# Patient Record
Sex: Female | Born: 1970 | Race: White | Hispanic: No | Marital: Married | Smoking: Never smoker
Health system: Southern US, Community
[De-identification: ages and names within clinical notes are randomized; demographics above are authoritative.]

## PROBLEM LIST (undated history)

## (undated) DIAGNOSIS — F419 Anxiety disorder, unspecified: Secondary | ICD-10-CM

## (undated) DIAGNOSIS — T7840XA Allergy, unspecified, initial encounter: Secondary | ICD-10-CM

## (undated) HISTORY — DX: Allergy, unspecified, initial encounter: T78.40XA

## (undated) HISTORY — DX: Anxiety disorder, unspecified: F41.9

---

## 2020-03-31 LAB — EXTERNAL GENERIC LAB PROCEDURE: COLOGUARD: NEGATIVE

## 2020-08-28 ENCOUNTER — Encounter: Payer: Self-pay | Admitting: Emergency Medicine

## 2020-08-28 ENCOUNTER — Emergency Department (INDEPENDENT_AMBULATORY_CARE_PROVIDER_SITE_OTHER): Payer: 59

## 2020-08-28 ENCOUNTER — Emergency Department (INDEPENDENT_AMBULATORY_CARE_PROVIDER_SITE_OTHER)
Admission: EM | Admit: 2020-08-28 | Discharge: 2020-08-28 | Disposition: A | Discharge status: Home / Self Care | Payer: 59 | Source: Home / Self Care

## 2020-08-28 ENCOUNTER — Other Ambulatory Visit: Payer: Self-pay

## 2020-08-28 DIAGNOSIS — W19XXXA Unspecified fall, initial encounter: Secondary | ICD-10-CM

## 2020-08-28 DIAGNOSIS — S92324A Nondisplaced fracture of second metatarsal bone, right foot, initial encounter for closed fracture: Secondary | ICD-10-CM

## 2020-08-28 DIAGNOSIS — M79671 Pain in right foot: Secondary | ICD-10-CM

## 2020-08-28 DIAGNOSIS — S92341A Displaced fracture of fourth metatarsal bone, right foot, initial encounter for closed fracture: Secondary | ICD-10-CM | POA: Diagnosis not present

## 2020-08-28 DIAGNOSIS — S92334A Nondisplaced fracture of third metatarsal bone, right foot, initial encounter for closed fracture: Secondary | ICD-10-CM | POA: Diagnosis not present

## 2020-08-28 MED ORDER — OXYCODONE-ACETAMINOPHEN 5-325 MG PO TABS: 1.0000 | ORAL_TABLET | Freq: Four times a day (QID) | ORAL | 0 refills | Status: AC | PRN

## 2020-08-28 MED ORDER — ACETAMINOPHEN 325 MG PO TABS
650.0000 mg | ORAL_TABLET | Freq: Once | ORAL | Status: AC
  Administered 2020-08-28: 650 mg via ORAL

## 2020-08-28 NOTE — Discharge Instructions (Addendum)
Advised patient to wear cam boot 24/7, except with bathing until evaluated by orthopedic provider.  Pain medication provided to patient.  Advised/encouraged patient to take medication as prescribed with food.  Vies patient may take ibuprofen 800 mg 2-3 times daily, as needed.  Encourage patient to increase daily water intake while taking medications.

## 2020-08-28 NOTE — ED Provider Notes (Signed)
[facility]    CSN: [id number] Arrival date & time: 08/28/20  1837      History   Chief Complaint Chief Complaint  Patient presents with  . Foot Injury    Right     HPI Misty [name] is a 50 y.o. female.   HPI 50 year old female presents for right foot pain.  Reports stepping off a curb onto a rock and rolling her right foot at roughly 4 PM today.  Reports pain is predominantly sole of right foot.  History reviewed. No pertinent past medical history.  There are no problems to display for this patient.   Past Surgical History:  Procedure Laterality Date  . CESAREAN SECTION      OB History   No obstetric history on file.      Home Medications    Prior to Admission medications   Medication Sig Start Date End Date Taking? Authorizing Provider  oxyCODONE-acetaminophen (PERCOCET/ROXICET) 5-325 MG tablet Take 1 tablet by mouth every 6 (six) hours as needed for up to 6 days for severe pain. 08/28/20 09/03/20 Yes Trevor Iha, FNP    Family History Family History  Problem Relation Age of Onset  . Breast cancer Mother   . Hypertension Mother   . Healthy Father     Social History Social History   Tobacco Use  . Smoking status: Never Smoker  . Smokeless tobacco: Never Used  Vaping Use  . Vaping Use: Never used  Substance Use Topics  . Alcohol use: Yes    Comment: occasional  . Drug use: Never     Allergies   Patient has no known allergies.   Review of Systems Review of Systems  Constitutional: Negative.   HENT: Negative.   Eyes: Negative.   Respiratory: Negative.   Cardiovascular: Negative.   Gastrointestinal: Negative.   Genitourinary: Negative.   Musculoskeletal:       Right Foot pain  Skin: Negative.   Neurological: Negative.      Physical Exam Triage Vital Signs ED Triage Vitals  Enc Vitals Group     BP 08/28/20 1859 113/76     Pulse Rate 08/28/20 1859 78     Resp 08/28/20 1859 16     Temp 08/28/20 1859 99 F (37.2  C)     Temp Source 08/28/20 1859 Oral     SpO2 08/28/20 1859 98 %     Weight 08/28/20 1853 150 lb (68 kg)     Height 08/28/20 1853 5\' 5"  (1.651 m)     Head Circumference --      Peak Flow --      Pain Score 08/28/20 1853 5     Pain Loc --      Pain Edu? --      Excl. in GC? --    No data found.  Updated Vital Signs BP 113/76 (BP Location: Right Arm)   Pulse 78   Temp 99 F (37.2 C) (Oral)   Resp 16   Ht 5\' 5"  (1.651 m)   Wt 150 lb (68 kg)   SpO2 98%   BMI 24.96 kg/m    Physical Exam Vitals and nursing note reviewed.  Constitutional:      General: She is not in acute distress.    Appearance: Normal appearance. She is normal weight.  HENT:     Head: Normocephalic and atraumatic.  Eyes:     Extraocular Movements: Extraocular movements intact.     Conjunctiva/sclera: Conjunctivae normal.  Pupils: Pupils are equal, round, and reactive to light.  Cardiovascular:     Rate and Rhythm: Normal rate and regular rhythm.     Pulses: Normal pulses.     Heart sounds: Normal heart sounds.  Pulmonary:     Effort: Pulmonary effort is normal. No respiratory distress.     Breath sounds: Normal breath sounds. No stridor. No wheezing, rhonchi or rales.  Musculoskeletal:     Comments: Right foot: TTP over dorsal aspect (over metatarsal bases), moderate soft tissue swelling noted, severely LROM with dorsiflexion plantar flexion, and limited by pain.  Neurovascular intact, neurosensory intact.  Skin:    General: Skin is warm and dry.  Neurological:     General: No focal deficit present.     Mental Status: She is alert and oriented to person, place, and time.  Psychiatric:        Mood and Affect: Mood normal.        Behavior: Behavior normal.      UC Treatments / Results  Labs (all labs ordered are listed, but only abnormal results are displayed) Labs Reviewed - No data to display  EKG   Radiology DG Foot Complete Right  Result Date: 08/28/2020 CLINICAL DATA:  Recent  fall with foot pain, initial encounter EXAM: RIGHT FOOT COMPLETE - 3+ VIEW COMPARISON:  None. FINDINGS: Transverse fractures are noted through the proximal aspect of the second, third and fourth metatarsals. No other abnormality is noted. No significant soft tissue changes are seen. IMPRESSION: Second through fourth metatarsal fractures proximally. Electronically Signed   By: Alcide Clever M.D.   On: 08/28/2020 19:19    Procedures Procedures (including critical care time)  Medications Ordered in UC Medications  acetaminophen (TYLENOL) tablet 650 mg (650 mg Oral Given 08/28/20 1904)    Initial Impression / Assessment and Plan / UC Course  I have reviewed the triage vital signs and the nursing notes.  Pertinent labs & imaging results that were available during my care of the patient were reviewed by me and considered in my medical decision making (see chart for details).     MDM: 1.  Transverse fractures of second through fourth metatarsals.  Patient placed in cam walking boot and referral to [facility] orthopedic provider prior to discharge.  Patient discharged home, hemodynamically stable. Final Clinical Impressions(s) / UC Diagnoses   Final diagnoses:  Nondisplaced fracture of second metatarsal bone, right foot, initial encounter for closed fracture  Closed nondisplaced fracture of third metatarsal bone of right foot, initial encounter  Closed displaced fracture of fourth metatarsal bone of right foot, initial encounter     Discharge Instructions     Advised patient to wear cam boot 24/7, except with bathing until evaluated by orthopedic provider.  Pain medication provided to patient.  Advised/encouraged patient to take medication as prescribed with food.  Vies patient may take ibuprofen 800 mg 2-3 times daily, as needed.  Encourage patient to increase daily water intake while taking medications.    ED Prescriptions    Medication Sig Dispense Auth. Provider   oxyCODONE-acetaminophen  (PERCOCET/ROXICET) 5-325 MG tablet Take 1 tablet by mouth every 6 (six) hours as needed for up to 6 days for severe pain. 24 tablet Trevor Iha, FNP     I have reviewed the PDMP during this encounter.   Trevor Iha, FNP 08/28/20 2031

## 2020-08-28 NOTE — ED Triage Notes (Signed)
Stepped off a curb onto a rock and rolled R foot - pain to R foot on the bottom at 1600 Ibuprofen (600mg ) at 1615 RICE prior to arrival  Pain w/ ambulation  COVID vaccine - no booster

## 2020-08-28 NOTE — ED Notes (Signed)
Referral information sent to Dr Ardelle Anton via fax to [employer] office. Digital copy sent with pt. Pt to call [facility]  on Monday. Pt is new to area, PCP will be Christen Butter, 1st appointment June 8th.

## 2020-09-02 ENCOUNTER — Encounter: Payer: Self-pay | Admitting: Podiatry

## 2020-09-02 ENCOUNTER — Ambulatory Visit (INDEPENDENT_AMBULATORY_CARE_PROVIDER_SITE_OTHER): Payer: 59 | Admitting: Podiatry

## 2020-09-02 ENCOUNTER — Other Ambulatory Visit: Payer: Self-pay

## 2020-09-02 ENCOUNTER — Ambulatory Visit (INDEPENDENT_AMBULATORY_CARE_PROVIDER_SITE_OTHER): Payer: 59

## 2020-09-02 DIAGNOSIS — M84374A Stress fracture, right foot, initial encounter for fracture: Secondary | ICD-10-CM

## 2020-09-02 DIAGNOSIS — S99921A Unspecified injury of right foot, initial encounter: Secondary | ICD-10-CM

## 2020-09-02 DIAGNOSIS — R600 Localized edema: Secondary | ICD-10-CM | POA: Diagnosis not present

## 2020-09-02 NOTE — Progress Notes (Signed)
Subjective:   Patient ID: Misty [name], female   DOB: 50 y.o.   MRN: [id number]   HPI Patient states that she had a fall 5 days ago on 08/23/2020 and broke bones in her right foot.  She went to urgent care told she had a fracture and wanted her to see [place].  Patient is wearing a boot cannot currently bear weight on her foot and it is swollen and does not smoke likes to be active   Review of Systems  All other systems reviewed and are negative.       Objective:  Physical Exam Vitals and nursing note reviewed.  Constitutional:      Appearance: She is well-developed.  Pulmonary:     Effort: Pulmonary effort is normal.  Musculoskeletal:        General: Normal range of motion.  Skin:    General: Skin is warm.  Neurological:     Mental Status: She is alert.     Neurovascular status was found to be intact muscle strength adequate negative Homans' sign noted with significant edema of the midfoot right with swelling from the ankle to the forefoot with the pain centered around the third metatarsal.  Patient is found to have good digital perfusion well oriented x3     Assessment:  Probability for fracture of the midfoot right with pain     Plan:  H&P reviewed condition and fracture and x-ray and at this point applied Unna boot and Ace wrap dispensed surgical shoe continue nonweightbearing boot usage gradual surgical shoe usage and hopefully return to soft shoe gear in about 4 weeks.  Reappoint as indicated  X-rays indicate there appears to be a fracture of the base of third metatarsal right possible second metatarsal right no indications of Lisfranc's dislocation currently

## 2020-09-10 ENCOUNTER — Ambulatory Visit: Payer: 59 | Admitting: Medical-Surgical

## 2021-10-03 IMAGING — DX DG FOOT COMPLETE 3+V*R*
3 series · 3 of 3 positions shown · non-contrast
Comparison: None.

CLINICAL DATA: Recent fall with foot pain, initial encounter

EXAM:
RIGHT FOOT COMPLETE - 3+ VIEW

[foot ap]
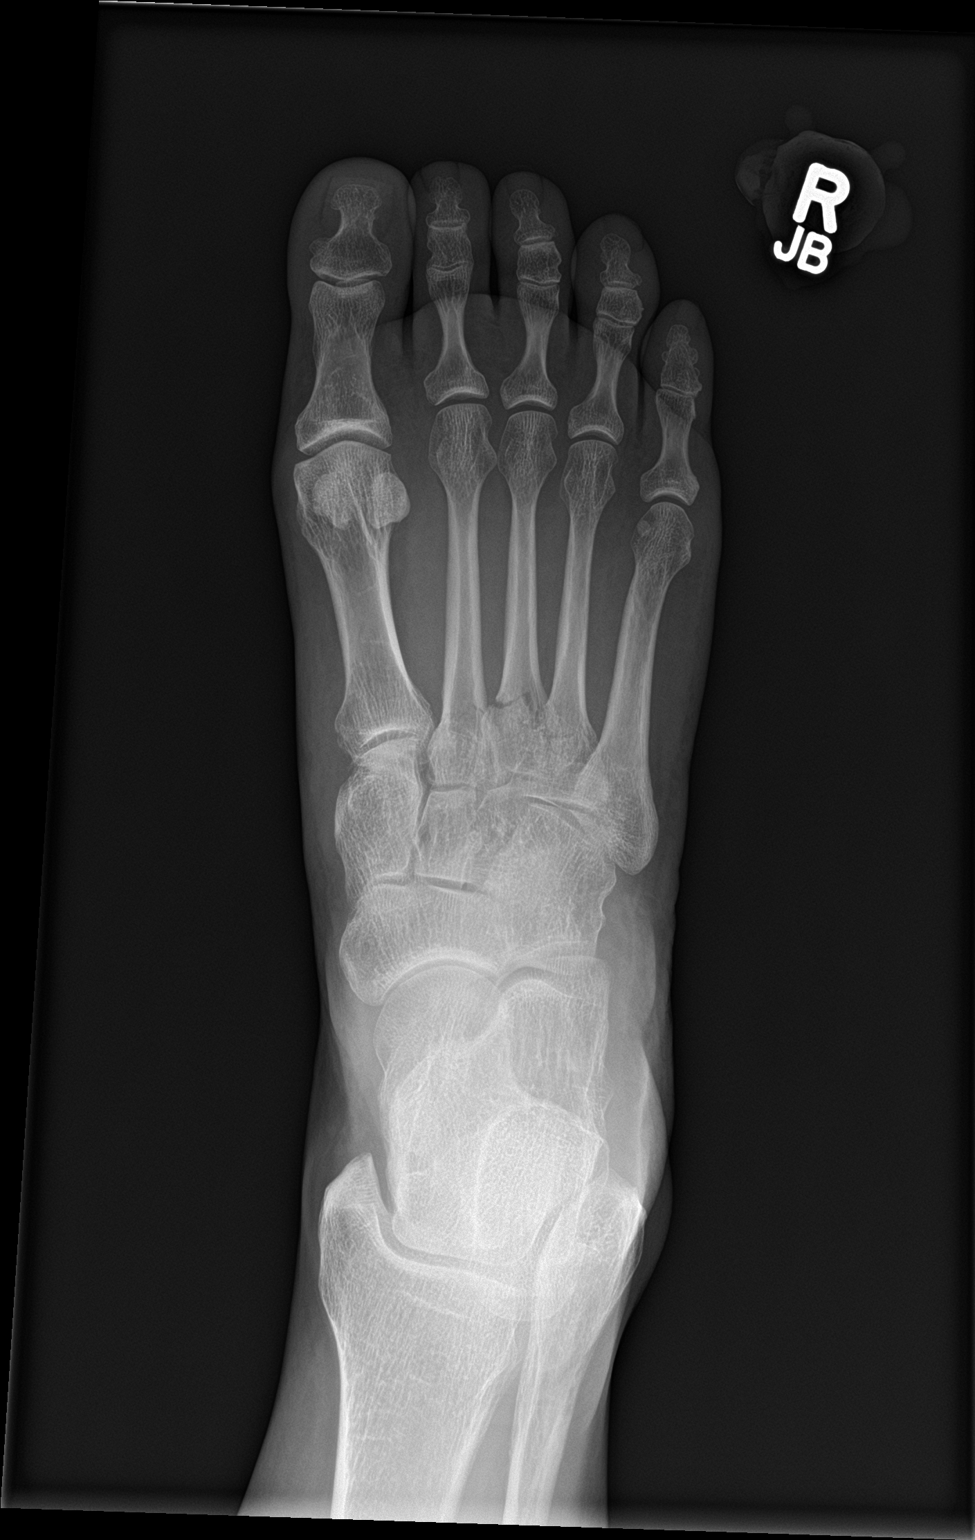

[foot obl]
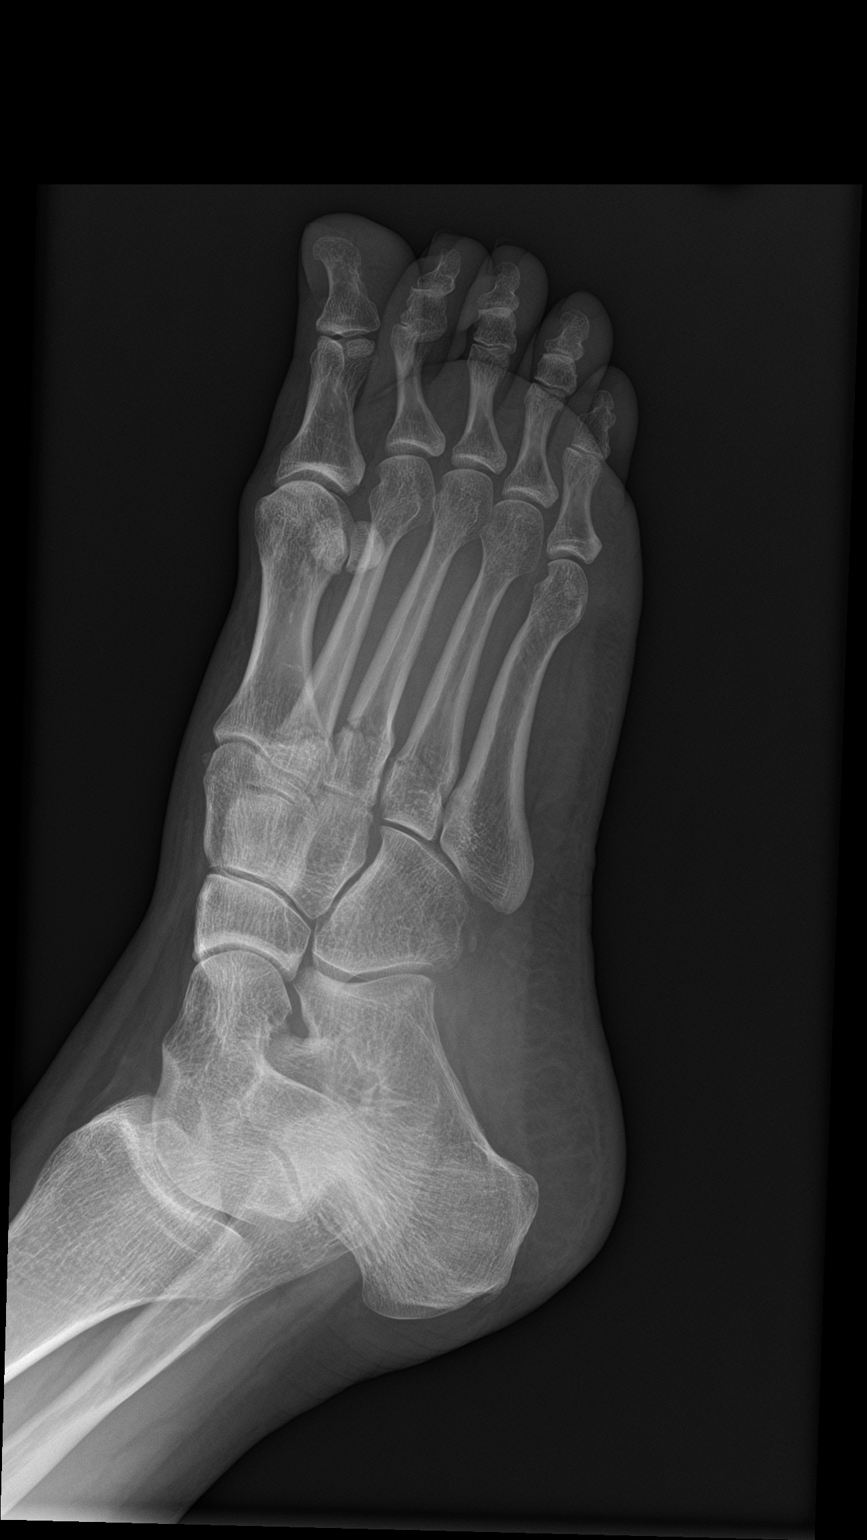

[foot lat]
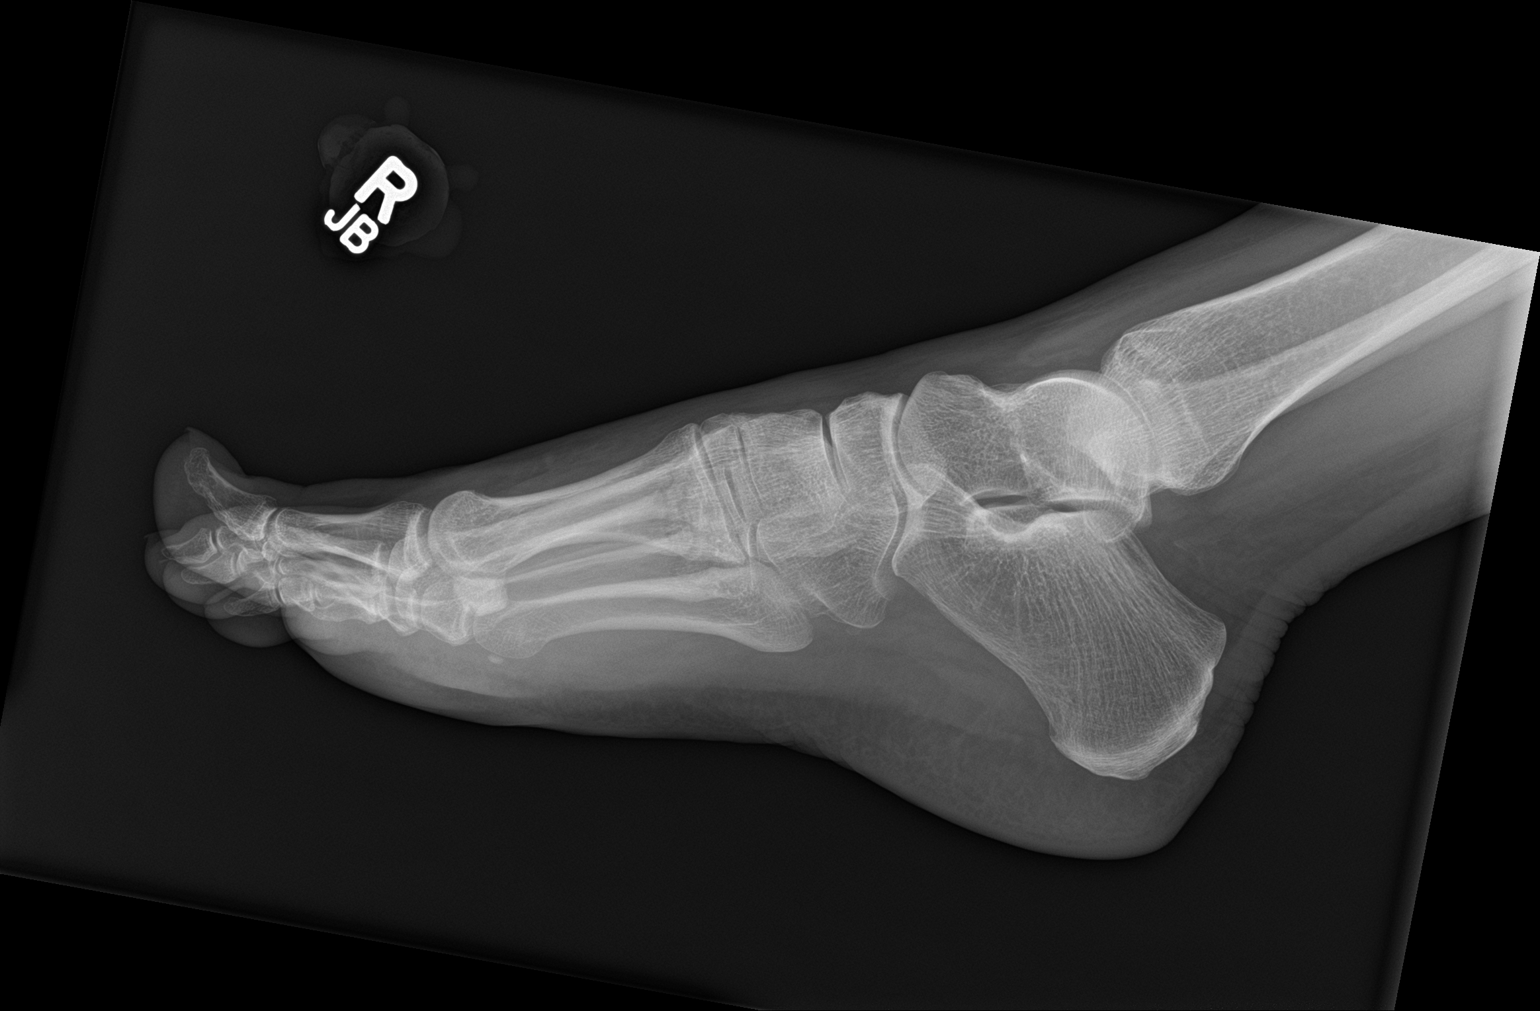

[3 of 3 positions shown; findings below may reference images not displayed]

FINDINGS: Transverse fractures are noted through the proximal aspect of the
second, third and fourth metatarsals. No other abnormality is noted.
No significant soft tissue changes are seen.
IMPRESSION: Second through fourth metatarsal fractures proximally.

## 2021-10-25 ENCOUNTER — Ambulatory Visit (INDEPENDENT_AMBULATORY_CARE_PROVIDER_SITE_OTHER): Payer: 59 | Admitting: Family Medicine

## 2021-10-25 ENCOUNTER — Encounter: Payer: Self-pay | Admitting: Family Medicine

## 2021-10-25 VITALS — BP 122/74 | HR 69 | Temp 98.6°F | Ht 65.0 in | Wt 157.5 lb

## 2021-10-25 DIAGNOSIS — Z0000 Encounter for general adult medical examination without abnormal findings: Secondary | ICD-10-CM

## 2021-10-25 DIAGNOSIS — Z1231 Encounter for screening mammogram for malignant neoplasm of breast: Secondary | ICD-10-CM

## 2021-10-25 LAB — CBC WITH DIFFERENTIAL/PLATELET
Basophils Absolute: 0 10*3/uL (ref 0.0–0.1)
Basophils Relative: 0.6 % (ref 0.0–3.0)
Eosinophils Absolute: 0.2 10*3/uL (ref 0.0–0.7)
Eosinophils Relative: 3.1 % (ref 0.0–5.0)
HCT: 39.3 % (ref 36.0–46.0)
Hemoglobin: 13 g/dL (ref 12.0–15.0)
Lymphocytes Relative: 32.7 % (ref 12.0–46.0)
Lymphs Abs: 2.1 10*3/uL (ref 0.7–4.0)
MCHC: 33.2 g/dL (ref 30.0–36.0)
MCV: 94.8 fl (ref 78.0–100.0)
Monocytes Absolute: 0.4 10*3/uL (ref 0.1–1.0)
Monocytes Relative: 5.9 % (ref 3.0–12.0)
Neutro Abs: 3.8 10*3/uL (ref 1.4–7.7)
Neutrophils Relative %: 57.7 % (ref 43.0–77.0)
Platelets: 372 10*3/uL (ref 150.0–400.0)
RBC: 4.14 Mil/uL (ref 3.87–5.11)
RDW: 13.4 % (ref 11.5–15.5)
WBC: 6.5 10*3/uL (ref 4.0–10.5)

## 2021-10-25 LAB — COMPREHENSIVE METABOLIC PANEL
ALT: 13 U/L (ref 0–35)
AST: 18 U/L (ref 0–37)
Albumin: 4.7 g/dL (ref 3.5–5.2)
Alkaline Phosphatase: 94 U/L (ref 39–117)
BUN: 15 mg/dL (ref 6–23)
CO2: 29 mEq/L (ref 19–32)
Calcium: 10.1 mg/dL (ref 8.4–10.5)
Chloride: 102 mEq/L (ref 96–112)
Creatinine, Ser: 0.73 mg/dL (ref 0.40–1.20)
GFR: 94.28 mL/min (ref >60.00)
Glucose, Bld: 85 mg/dL (ref 70–99)
Potassium: 4 mEq/L (ref 3.5–5.1)
Sodium: 141 mEq/L (ref 135–145)
Total Bilirubin: 0.4 mg/dL (ref 0.2–1.2)
Total Protein: 7.8 g/dL (ref 6.0–8.3)

## 2021-10-25 LAB — HEMOGLOBIN A1C: Hgb A1c MFr Bld: 5.5 % (ref 4.6–6.5)

## 2021-10-25 LAB — LIPID PANEL
Cholesterol: 168 mg/dL (ref 0–200)
HDL: 81.5 mg/dL (ref >39.00)
LDL Cholesterol: 77 mg/dL (ref 0–99)
NonHDL: 86.15
Total CHOL/HDL Ratio: 2
Triglycerides: 44 mg/dL (ref 0.0–149.0)
VLDL: 8.8 mg/dL (ref 0.0–40.0)

## 2021-10-25 LAB — TSH: TSH: 1.41 u[IU]/mL (ref 0.35–5.50)

## 2021-10-25 NOTE — Progress Notes (Signed)
New Patient Office Visit  Subjective:  Patient ID: Misty [name], female    DOB: Oct 28, 1970  Age: 51 y.o. MRN: [id number]  CC:  Chief Complaint  Patient presents with   Establish Care    Need new PCP Discuss weight and menopause Had coffee, no food this morning    HPI Misty [name] presents for new pt-moved 1.5 yrs ago-[place] cpx LMP 1 yr ago Wt-perimenopause 2020-gained 15-20#.  Working on diet.  Eats 1500cal/d.  Tried 1200cal and too exhausted so inc to 1500 again. Measures all foods. Bloats a lot and hands swell.  Drinks a lot of water. Walks twice weekly.   Gets hot at night a lot. Frustrated w/aging process/can't lose wt,  etc.   Takes collagen.   Pap 2 yrs ago Cologard 2 yrs ago Tdap <43yrs.   Past Medical History:  Diagnosis Date   Allergy    Anxiety     Past Surgical History:  Procedure Laterality Date   CESAREAN SECTION  2005    Family History  Problem Relation Age of Onset   Hearing loss Mother    Cancer Mother 64       breast, skin   Breast cancer Mother    Hypertension Mother    Healthy Father    Hearing loss Maternal Grandmother    Arthritis Maternal Grandmother    Early death Maternal Grandfather    Heart attack Maternal Grandfather    COPD Paternal Grandmother    Stroke Paternal Grandmother 65   Early death Paternal Grandmother    COPD Paternal Grandfather     Social History   Socioeconomic History   Marital status: Married    Spouse name: Not on file   Number of children: 1   Years of education: Not on file   Highest education level: Not on file  Occupational History   Not on file  Tobacco Use   Smoking status: Never   Smokeless tobacco: Never  Vaping Use   Vaping Use: Never used  Substance and Sexual Activity   Alcohol use: Yes    Alcohol/week: 5.0 - 7.0 standard drinks of alcohol    Types: 5 - 7 Glasses of wine per week    Comment: occasional   Drug use: Never   Sexual activity: Yes    Birth control/protection: None   Other Topics Concern   Not on file  Social History Narrative   Radio producer   Social Determinants of Health   Financial Resource Strain: Not on file  Food Insecurity: Not on file  Transportation Needs: Not on file  Physical Activity: Not on file  Stress: Not on file  Social Connections: Not on file  Intimate Partner Violence: Not on file    ROS  ROS: Gen: no fever, chills  Skin: no rash, itching ENT: no ear pain, ear drainage, nasal congestion, rhinorrhea, sinus pressure, sore throat Eyes: no blurry vision, double vision Resp: no cough, wheeze,SOB CV: no CP, palpitations, LE edema,  GI: no heartburn, n/v/d, abd pain.  Some bloating and constipation.  GU: no dysuria, urgency, frequency, hematuria MSK: some knee pain.  [place]are[place] Seat last yr-stepped off curb and fx L foot.  Neuro: no dizziness, headache, weakness, vertigo Psych: no depression, anxiety, insomnia, SI    a little edgy-takes otc supp-helps, but if takes 2, too much  Objective:   Today's Vitals: BP 122/74   Pulse 69   Temp 98.6 F (37 C) (Temporal)   Ht 5\' 5"  (1.651  m)   Wt 157 lb 8 oz (71.4 kg)   SpO2 99%   BMI 26.21 kg/m   Physical Exam  Gen: WDWN NAD wf HEENT: NCAT, conjunctiva not injected, sclera nonicteric TM WNL B, OP moist, no exudates  NECK:  supple, no thyromegaly, no nodes, no carotid bruits CARDIAC: RRR, S1S2+, no murmur. DP 2+B LUNGS: CTAB. No wheezes ABDOMEN:  BS+, soft, NTND, No HSM, no masses EXT:  no edema MSK: no gross abnormalities.  NEURO: A&O x3.  CN II-XII intact.  PSYCH: normal mood. Good eye contact   Assessment & Plan:   Problem List Items Addressed This Visit   None Visit Diagnoses     Encounter for screening mammogram for malignant neoplasm of breast    -  Primary   Relevant Orders   MM 3D SCREEN BREAST BILATERAL   Wellness examination       Relevant Orders   Comprehensive metabolic panel (Completed)   Hemoglobin A1c (Completed)   Lipid panel  (Completed)   TSH (Completed)   CBC with Differential/Platelet (Completed)      Wellness-antic guidance.  Check cbc,cmp,tsh,lipids,a1c.  Cont TLC.  Inc exercise.  Menopause-discussed normalcy of what she is going thru and listened to frustrations.  Since time limited, do shorter exercises but work on increasing from what she is doing. Decrease ETOH  Outpatient Encounter Medications as of 10/25/2021  Medication Sig   Ascorbic Acid (VITAMIN C PO) Take 1 tablet by mouth daily.   ASHWAGANDHA PO Take 2 tablets by mouth daily. 1-2 tablets daily   EVENING PRIMROSE OIL PO Take 2 tablets by mouth daily.   ibuprofen (ADVIL) 200 MG tablet Take 200 mg by mouth every 6 (six) hours as needed.   MAGNESIUM PO Take 1 tablet by mouth daily.   Multiple Vitamin (MULTIVITAMIN) tablet Take 1 tablet by mouth daily.   VITAMIN D PO Take 1 tablet by mouth daily.   No facility-administered encounter medications on file as of 10/25/2021.    Follow-up: Return in about 1 year (around 10/26/2022) for annual.   Misty Sole, MD

## 2021-10-25 NOTE — Patient Instructions (Signed)
Welcome to [facility] at [place]! It was a pleasure meeting you today. ° °As discussed, Please schedule a 12 month follow up visit today. ° °PLEASE NOTE: ° °If you had any LAB tests please let us know if you have not heard back within a few days. You may see your results on MyChart before we have a chance to review them but we will give you a call once they are reviewed by us. If we ordered any REFERRALS today, please let us know if you have not heard from their office within the next week.  °Let us know through MyChart if you are needing REFILLS, or have your pharmacy send us the request. You can also use MyChart to communicate with me or any office staff. ° °Please try these tips to maintain a healthy lifestyle: ° °Eat most of your calories during the day when you are active. Eliminate processed foods including packaged sweets (pies, cakes, cookies), reduce intake of potatoes, white bread, white pasta, and white rice. Look for whole grain options, oat flour or almond flour. ° °Each meal should contain half fruits/vegetables, one quarter protein, and one quarter carbs (no bigger than a computer mouse). ° °Cut down on sweet beverages. This includes juice, soda, and sweet tea. Also watch fruit intake, though this is a healthier sweet option, it still contains natural sugar! Limit to 3 servings daily. ° °Drink at least 1 glass of water with each meal and aim for at least 8 glasses per day ° °Exercise at least 150 minutes every week.   °

## 2021-11-03 ENCOUNTER — Ambulatory Visit
Admission: RE | Admit: 2021-11-03 | Discharge: 2021-11-03 | Disposition: A | Discharge status: Home / Self Care | Payer: 59 | Source: Ambulatory Visit | Attending: Family Medicine | Admitting: Family Medicine

## 2021-11-03 DIAGNOSIS — Z1231 Encounter for screening mammogram for malignant neoplasm of breast: Secondary | ICD-10-CM

## 2021-11-15 ENCOUNTER — Encounter: Payer: Self-pay | Admitting: Family Medicine

## 2021-11-15 LAB — HM MAMMOGRAPHY

## 2022-02-22 ENCOUNTER — Encounter: Payer: Self-pay | Admitting: Family Medicine

## 2022-02-22 ENCOUNTER — Telehealth (INDEPENDENT_AMBULATORY_CARE_PROVIDER_SITE_OTHER): Payer: 59 | Admitting: Family Medicine

## 2022-02-22 VITALS — Temp 99.8°F | Ht 65.0 in | Wt 150.0 lb

## 2022-02-22 DIAGNOSIS — U071 COVID-19: Secondary | ICD-10-CM | POA: Diagnosis not present

## 2022-02-22 MED ORDER — NIRMATRELVIR/RITONAVIR (PAXLOVID)TABLET: 3.0000 | ORAL_TABLET | Freq: Two times a day (BID) | ORAL | 0 refills | Status: AC

## 2022-02-22 NOTE — Progress Notes (Signed)
Virtual Visit via Video Note  I connected with Misty [name]  on 02/22/22 at 12:00 PM EST by a video enabled telemedicine application and verified that I am speaking with the correct person using two identifiers.  Location patient: [name] Location provider:work or home office Persons participating in the virtual visit: patient, provider  I discussed the limitations and requested verbal permission for telemedicine visit. The patient expressed understanding and agreed to proceed.   HPI:  Acute telemedicine visit for Covid: -Onset: yesterday, tested positive yesterday -Symptoms include: nasal congestion, fever yesterday, chills, headache -Denies: CP, SOB, NVD, inability to eat/drink/get out of bed -Has tried: otc cold and sinus  -Pertinent past medical history: see below, has not had covid before, denies immune, renal or liver disease, GFR was 94 in July -Pertinent medication allergies:No Known Allergies -COVID-19 vaccine status: had 2 doses only several years ago  There is no immunization history on file for this patient.   ROS: See pertinent positives and negatives per HPI.  Past Medical History:  Diagnosis Date   Allergy    Anxiety     Past Surgical History:  Procedure Laterality Date   CESAREAN SECTION  2005     Current Outpatient Medications:    Ascorbic Acid (VITAMIN C PO), Take 1 tablet by mouth daily., Disp: , Rfl:    ASHWAGANDHA PO, Take 2 tablets by mouth daily. 1-2 tablets daily, Disp: , Rfl:    ibuprofen (ADVIL) 200 MG tablet, Take 200 mg by mouth every 6 (six) hours as needed., Disp: , Rfl:    MAGNESIUM PO, Take 1 tablet by mouth daily., Disp: , Rfl:    Multiple Vitamin (MULTIVITAMIN) tablet, Take 1 tablet by mouth daily., Disp: , Rfl:    nirmatrelvir/ritonavir EUA (PAXLOVID) 20 x 150 MG & 10 x 100MG  TABS, Take 3 tablets by mouth 2 (two) times daily for 5 days. (Take nirmatrelvir 150 mg two tablets twice daily for 5 days and ritonavir 100 mg one tablet twice daily for 5  days) Patient GFR is > 60 in july, Disp: 30 tablet, Rfl: 0   VITAMIN D PO, Take 1 tablet by mouth daily., Disp: , Rfl:    EVENING PRIMROSE OIL PO, Take 2 tablets by mouth daily. (Patient not taking: Reported on 02/22/2022), Disp: , Rfl:   EXAM:  VITALS per patient if applicable:  GENERAL: alert, oriented, appears well and in no acute distress  HEENT: atraumatic, conjunttiva clear, no obvious abnormalities on inspection of external nose and ears  NECK: normal movements of the head and neck  LUNGS: on inspection no signs of respiratory distress, breathing rate appears normal, no obvious gross SOB, gasping or wheezing  CV: no obvious cyanosis  MS: moves all visible extremities without noticeable abnormality  PSYCH/NEURO: pleasant and cooperative, no obvious depression or anxiety, speech and thought processing grossly intact  ASSESSMENT AND PLAN:  Discussed the following assessment and plan:  COVID-19   Discussed treatment options, side effect and risk of drug interactions, ideal treatment window, potential complications, isolation and precautions for COVID-19.  Discussed possibility of rebound with or without antivirals. Checked for/reviewed last GFR - listed in HPI if available. After lengthy discussion, the patient opted for treatment with Paxlovid due to being higher risk for complications of covid or severe disease and other factors. Discussed EUA status of this drug and the fact that there is preliminary limited knowledge of risks/interactions/side effects per EUA document vs possible benefits and precautions. This information was shared with patient during the visit  and also was provided in patient instructions. Also, advised that patient discuss risks/interactions and use with pharmacist/treatment team as well. She agrees to hold her supplements while taking the paxlovid.Other symptomatic care measures summarized in patient instructions. Work/School slipped offered:   declined Advised to seek prompt virtual visit or in person care if worsening, new symptoms arise, or if is not improving with treatment as expected per our conversation of expected course. Discussed options for follow up care. Did let this patient know that I do telemedicine on Tuesdays and Thursdays for [place] and those are the days I am logged into the system. Advised to schedule follow up visit with PCP, [place] virtual visits or [facility] if any further questions or concerns to avoid delays in care.   I discussed the assessment and treatment plan with the patient. The patient was provided an opportunity to ask questions and all were answered. The patient agreed with the plan and demonstrated an understanding of the instructions.     Misty Koyanagi, DO

## 2022-02-22 NOTE — Patient Instructions (Signed)
HOME CARE TIPS:   -I sent the medication(s) we discussed to your pharmacy: Meds ordered this encounter  Medications   nirmatrelvir/ritonavir EUA (PAXLOVID) 20 x 150 MG & 10 x 100MG  TABS    Sig: Take 3 tablets by mouth 2 (two) times daily for 5 days. (Take nirmatrelvir 150 mg two tablets twice daily for 5 days and ritonavir 100 mg one tablet twice daily for 5 days) Patient GFR is > 60 in july    Dispense:  30 tablet    Refill:  0     -I sent in the Covid19 treatment or referral you requested per our discussion. Please see the information provided below and discuss further with the pharmacist/treatment team.  -If taking Paxlovid, please review all medications, supplement and over the counter drugs with your pharmacist and ask them to check for any interactions. Please make the following changes to your regular medications while taking Paxlovid: *HOLD you vitamins and supplements while taking the Paxlovid, can restart 3 days after finishing the Paxlovid  -there is a chance of rebound illness with covid after improving. This can happen whether or not you take an antiviral treatment. If you become sick again with covid after getting better, please schedule a follow up virtual visit and isolate again.  -nasal saline sinus rinses twice daily  -stay hydrated, drink plenty of fluids and eat small healthy meals - avoid dairy  -follow up with your doctor in 2-3 days unless improving and feeling better  -stay home while sick, except to seek medical care. If you have COVID19, you will likely be contagious for 7-10 days. Flu or Influenza is likely contagious for about 7 days. Other respiratory viral infections remain contagious for 5-10+ days depending on the virus and many other factors. Wear a good mask that fits snugly (such as N95 or KN95) if around others to reduce the risk of transmission.  It was nice to meet you today, and I really hope you are feeling better soon. I help Lloyd out with  telemedicine visits on Tuesdays and Thursdays and am happy to help if you need a follow up virtual visit on those days. Otherwise, if you have any concerns or questions following this visit please schedule a follow up visit with your Primary Care doctor or seek care at a local urgent care clinic to avoid delays in care.    Seek in person care or schedule a follow up video visit promptly if your symptoms worsen, new concerns arise or you are not improving with treatment. Call 911 and/or seek emergency care if your symptoms are severe or life threatening.   See the following link for the most recent information regarding Paxlovid:  www.[email or web address].com   Nirmatrelvir; Ritonavir Tablets What is this medication? NIRMATRELVIR; RITONAVIR (NIR ma TREL vir; ri TOE na veer) treats mild to moderate COVID-19. It may help people who are at high risk of developing severe illness. This medication works by limiting the spread of the virus in your body. The FDA has allowed the emergency use of this medication. This medicine may be used for other purposes; ask your health care provider or pharmacist if you have questions. COMMON BRAND NAME(S): PAXLOVID What should I tell my care team before I take this medication? They need to know if you have any of these conditions: Any allergies Any serious illness Kidney disease Liver disease An unusual or allergic reaction to nirmatrelvir, ritonavir, other medications, foods, dyes, or preservatives Pregnant or trying to get pregnant Breast-feeding  How should I use this medication? This product contains 2 different medications that are packaged together. For the standard dose, take 2 pink tablets of nirmatrelvir with 1 white tablet of ritonavir (3 tablets total) by mouth with water twice daily. Talk to your care team if you have kidney disease. You may need a different dose. Swallow the tablets whole. You can take it with or without food. If it upsets your stomach, take it  with food. Take all of this medication unless your care team tells you to stop it early. Keep taking it even if you think you are better. Talk to your care team about the use of this medication in children. While it may be prescribed for children as young as 12 years for selected conditions, precautions do apply. Overdosage: If you think you have taken too much of this medicine contact a poison control center or emergency room at once. NOTE: This medicine is only for you. Do not share this medicine with others. What if I miss a dose? If you miss a dose, take it as soon as you can unless it is more than 8 hours late. If it is more than 8 hours late, skip the missed dose. Take the next dose at the normal time. Do not take extra or 2 doses at the same time to make up for the missed dose. What may interact with this medication? Do not take this medication with any of the following medications: Alfuzosin Certain medications for anxiety or sleep like midazolam, triazolam Certain medications for cancer like apalutamide, enzalutamide Certain medications for cholesterol like lovastatin, simvastatin Certain medications for irregular heart beat like amiodarone, dronedarone, flecainide, propafenone, quinidine Certain medications for pain like meperidine, piroxicam Certain medications for psychotic disorders like clozapine, lurasidone, pimozide Certain medications for seizures like carbamazepine, phenobarbital, phenytoin Colchicine Eletriptan Eplerenone Ergot alkaloids like dihydroergotamine, ergonovine, ergotamine, methylergonovine Finerenone Flibanserin Ivabradine Lomitapide Naloxegol Ranolazine Rifampin Sildenafil Silodosin St. John's Wort Tolvaptan Ubrogepant Voclosporin This medication may also interact with the following medications: Bedaquiline Birth control pills Bosentan Certain antibiotics like erythromycin or clarithromycin Certain medications for blood pressure like amlodipine,  diltiazem, felodipine, nicardipine, nifedipine Certain medications for cancer like abemaciclib, ceritinib, dasatinib, encorafenib, ibrutinib, ivosidenib, neratinib, nilotinib, venetoclax, vinblastine, vincristine Certain medications for cholesterol like atorvastatin, rosuvastatin Certain medications for depression like bupropion, trazodone Certain medications for fungal infections like isavuconazonium, itraconazole, ketoconazole, voriconazole Certain medications for hepatitis C like elbasvir; grazoprevir, dasabuvir; ombitasvir; paritaprevir; ritonavir, glecaprevir; pibrentasvir, sofosbuvir; velpatasvir; voxilaprevir Certain medications for HIV or AIDS Certain medications for irregular heartbeat like lidocaine Certain medications that treat or prevent blood clots like rivaroxaban, warfarin Digoxin Fentanyl Medications that lower your chance of fighting infection like cyclosporine, sirolimus, tacrolimus Methadone Quetiapine Rifabutin Salmeterol Steroid medications like betamethasone, budesonide, ciclesonide, dexamethasone, fluticasone, methylprednisone, mometasone, triamcinolone This list may not describe all possible interactions. Give your health care provider a list of all the medicines, herbs, non-prescription drugs, or dietary supplements you use. Also tell them if you smoke, drink alcohol, or use illegal drugs. Some items may interact with your medicine. What should I watch for while using this medication? Your condition will be monitored carefully while you are receiving this medication. Visit your care team for regular checkups. Tell your care team if your symptoms do not start to get better or if they get worse. If you have untreated HIV infection, this medication may lead to some HIV medications not working as well in the future. Birth control may not work properly while you  are taking this medication. Talk to your care team about using an extra method of birth control. What side  effects may I notice from receiving this medication? Side effects that you should report to your care team as soon as possible: Allergic reactions--skin rash, itching, hives, swelling of the face, lips, tongue, or throat Liver injury--right upper belly pain, loss of appetite, nausea, light-colored stool, dark yellow or brown urine, yellowing skin or eyes, unusual weakness or fatigue Redness, blistering, peeling, or loosening of the skin, including inside the mouth Side effects that usually do not require medical attention (report these to your care team if they continue or are bothersome): Change in taste Diarrhea General discomfort and fatigue Increase in blood pressure Muscle pain Nausea Stomach pain This list may not describe all possible side effects. Call your doctor for medical advice about side effects. You may report side effects to FDA at 1-800-FDA-1088. Where should I keep my medication? Keep out of the reach of children and pets. Store at room temperature between 20 and 25 degrees C (68 and 77 degrees F). Get rid of any unused medication after the expiration date. To get rid of medications that are no longer needed or have expired: Take the medication to a medication take-back program. Check with your pharmacy or law enforcement to find a location. If you cannot return the medication, check the label or package insert to see if the medication should be thrown out in the garbage or flushed down the toilet. If you are not sure, ask your care team. If it is safe to put it in the trash, take the medication out of the container. Mix the medication with cat litter, dirt, coffee grounds, or other unwanted substance. Seal the mixture in a bag or container. Put it in the trash. NOTE: This sheet is a summary. It may not cover all possible information. If you have questions about this medicine, talk to your doctor, pharmacist, or health care provider.  2022 Elsevier/Gold Standard (2020-12-28  00:00:00)

## 2022-07-21 ENCOUNTER — Ambulatory Visit (INDEPENDENT_AMBULATORY_CARE_PROVIDER_SITE_OTHER): Payer: Self-pay | Admitting: Family

## 2022-07-21 ENCOUNTER — Encounter: Payer: Self-pay | Admitting: Family

## 2022-07-21 VITALS — BP 124/85 | HR 65 | Temp 98.2°F | Wt 157.8 lb

## 2022-07-21 DIAGNOSIS — L0291 Cutaneous abscess, unspecified: Secondary | ICD-10-CM

## 2022-07-21 MED ORDER — DOXYCYCLINE HYCLATE 100 MG PO TABS: 100.0000 mg | ORAL_TABLET | Freq: Two times a day (BID) | ORAL | 0 refills | Status: AC

## 2022-07-21 NOTE — Progress Notes (Signed)
Patient ID: Misty [name], female    DOB: Nov 27, 1970, 52 y.o.   MRN: [id number]  Chief Complaint  Patient presents with   Shoulder Pain    Pt c/o right shoulder pain since this morning. Hx of mrsa     HPI:      Skin cyst:  reports having a tender cyst on upper right posterior shoulder. Reports having same type of cyst in same location but on her other side. States she waited too long to be seen and it required draining, suturing and antibiotics. Denies any spontaneous drainage, no fever.  Assessment & Plan:  1. Abscess - advised pt to wear a soft strap bra or sports bra to avoid irritating cyst and causing to spontaneously open & drain. Sending DOXY, advised on use & SE. Advised if cyst does open and drain, gently express all pus as able, clean with soap and water and apply thin layer of Bacitracin ointment (provided samples in office). Keep covered until no longer draining. Advised on s/s of infection and when to call the office.  - doxycycline (VIBRA-TABS) 100 MG tablet; Take 1 tablet (100 mg total) by mouth 2 (two) times daily for 10 days.  Dispense: 20 tablet; Refill: 0  Subjective:    Outpatient Medications Prior to Visit  Medication Sig Dispense Refill   Ascorbic Acid (VITAMIN C PO) Take 1 tablet by mouth daily.     ASHWAGANDHA PO Take 2 tablets by mouth daily. 1-2 tablets daily     EVENING PRIMROSE OIL PO Take 2 tablets by mouth daily.     ibuprofen (ADVIL) 200 MG tablet Take 200 mg by mouth every 6 (six) hours as needed.     MAGNESIUM PO Take 1 tablet by mouth daily.     Multiple Vitamin (MULTIVITAMIN) tablet Take 1 tablet by mouth daily.     VITAMIN D PO Take 1 tablet by mouth daily.     No facility-administered medications prior to visit.   Past Medical History:  Diagnosis Date   Allergy    Anxiety    Past Surgical History:  Procedure Laterality Date   CESAREAN SECTION  2005   No Known Allergies    Objective:    Physical Exam Vitals and nursing note reviewed.   Constitutional:      Appearance: Normal appearance.  Cardiovascular:     Rate and Rhythm: Normal rate and regular rhythm.  Pulmonary:     Effort: Pulmonary effort is normal.     Breath sounds: Normal breath sounds.  Musculoskeletal:        General: Normal range of motion.       Arms:  Skin:    General: Skin is warm and dry.     Findings: Abscess (approx 2cm in diameter, pink skin, approx. 3cm of induration noted in 10:00-11:00 area from cyst) present.  Neurological:     Mental Status: She is alert.     Gait: Abnormal gait: skin cyst.  Psychiatric:        Mood and Affect: Mood normal.        Behavior: Behavior normal.    BP 124/85 (BP Location: Left Arm, Patient Position: Sitting, Cuff Size: Normal)   Pulse 65   Temp 98.2 F (36.8 C) (Temporal)   Wt 157 lb 12.8 oz (71.6 kg)   SpO2 100%   BMI 26.26 kg/m  Wt Readings from Last 3 Encounters:  07/21/22 157 lb 12.8 oz (71.6 kg)  02/22/22 150 lb (68 kg)  10/25/21  157 lb 8 oz (71.4 kg)     Dulce Sellar, NP

## 2022-08-05 ENCOUNTER — Telehealth: Payer: Self-pay | Admitting: Family Medicine

## 2022-08-05 DIAGNOSIS — L0291 Cutaneous abscess, unspecified: Secondary | ICD-10-CM

## 2022-08-05 NOTE — Telephone Encounter (Signed)
Patient states symptoms have not improved-finished antibiotic prescribed 07/21/22.  Requests to be called to be advised.

## 2022-08-05 NOTE — Telephone Encounter (Signed)
Please advise, saw Hudnell on 07/21/22

## 2022-08-09 NOTE — Telephone Encounter (Signed)
Send a referral to Dermatology ([facility]) but this can take a month to get seen, she may need to go to UC to get excised or see if Dr Ruthine Dose will do this for her.

## 2022-08-10 ENCOUNTER — Ambulatory Visit (INDEPENDENT_AMBULATORY_CARE_PROVIDER_SITE_OTHER): Payer: BC Managed Care – PPO | Admitting: Family Medicine

## 2022-08-10 ENCOUNTER — Encounter: Payer: Self-pay | Admitting: Family Medicine

## 2022-08-10 VITALS — BP 126/86 | HR 71 | Temp 98.6°F | Resp 16 | Ht 65.0 in | Wt 158.0 lb

## 2022-08-10 DIAGNOSIS — L989 Disorder of the skin and subcutaneous tissue, unspecified: Secondary | ICD-10-CM

## 2022-08-10 NOTE — Progress Notes (Signed)
   Subjective:     Patient ID: Misty [name], female    DOB: 1970-08-04, 52 y.o.   MRN: [id number]  Chief Complaint  Patient presents with   Cyst    Right shoulder    HPI Cyst right shoulder-seen 4/11.  No drainage.  No pain.  Placed on doxycycline-slightly better but still there.  No fevers/chills.  +  history of skin cancer.  History of cyst same area and spot rupture, returned 2 years later and abx and resolved.    Health Maintenance Due  Topic Date Due   COVID-19 Vaccine (1) Never done   DTaP/Tdap/Td (1 - Tdap) Never done   Zoster Vaccines- Shingrix (1 of 2) Never done   PAP SMEAR-Modifier  10/10/2022    Past Medical History:  Diagnosis Date   Allergy    Anxiety     Past Surgical History:  Procedure Laterality Date   CESAREAN SECTION  2005     Current Outpatient Medications:    Ascorbic Acid (VITAMIN C PO), Take 1 tablet by mouth daily., Disp: , Rfl:    ASHWAGANDHA PO, Take 2 tablets by mouth daily. 1-2 tablets daily, Disp: , Rfl:    EVENING PRIMROSE OIL PO, Take 2 tablets by mouth daily., Disp: , Rfl:    ibuprofen (ADVIL) 200 MG tablet, Take 200 mg by mouth every 6 (six) hours as needed., Disp: , Rfl:    MAGNESIUM PO, Take 1 tablet by mouth daily., Disp: , Rfl:    Multiple Vitamin (MULTIVITAMIN) tablet, Take 1 tablet by mouth daily., Disp: , Rfl:    VITAMIN D PO, Take 1 tablet by mouth daily., Disp: , Rfl:   No Known Allergies ROS neg/noncontributory except as noted HPI/below      Objective:     BP 126/86   Pulse 71   Temp 98.6 F (37 C) (Temporal)   Resp 16   Ht 5\' 5"  (1.651 m)   Wt 158 lb (71.7 kg)   SpO2 95%   BMI 26.29 kg/m  Wt Readings from Last 3 Encounters:  08/10/22 158 lb (71.7 kg)  07/21/22 157 lb 12.8 oz (71.6 kg)  02/22/22 150 lb (68 kg)    Physical Exam   Gen: WDWN NAD HEENT: NCAT, conjunctiva not injected, sclera nonicteric EXT:  no edema MSK: no gross abnormalities.  NEURO: A&O x3.  CN II-XII intact.  PSYCH: normal mood.  Good eye contact Skin-right back-shoulder blade area-approximately 4 mm slightly darker than flesh color, raised, flaccid annular lesion with umbilicated center.  Easily pushes down under skin.  There is some vasculature leading up to it.  No sign or symptoms of infection.     Assessment & Plan:  Skin lesion  Skin lesion-question if partly recurrent sebaceous cyst, possibly scar remnant, possibly skin cancer.  Currently is not infected.  She will call dermatology and try to get an appointment.  If needs a referral, she will let us know.  If cannot get an appointment within the next 1 to 2 months, let us know and will refer to either surgery or plastics  Angelena Sole, MD

## 2022-08-10 NOTE — Telephone Encounter (Signed)
Referral sent, I spoke to pt in regards.

## 2022-08-10 NOTE — Patient Instructions (Signed)
See dermatology.  If >2-3 months, let me know to send to surgeon/plastics

## 2022-10-19 ENCOUNTER — Ambulatory Visit (INDEPENDENT_AMBULATORY_CARE_PROVIDER_SITE_OTHER): Payer: BC Managed Care – PPO | Admitting: Dermatology

## 2022-10-19 ENCOUNTER — Encounter: Payer: Self-pay | Admitting: Dermatology

## 2022-10-19 VITALS — BP 133/89

## 2022-10-19 DIAGNOSIS — L720 Epidermal cyst: Secondary | ICD-10-CM | POA: Diagnosis not present

## 2022-10-19 DIAGNOSIS — L729 Follicular cyst of the skin and subcutaneous tissue, unspecified: Secondary | ICD-10-CM

## 2022-10-19 MED ORDER — DOXYCYCLINE HYCLATE 100 MG PO CAPS: 100.0000 mg | ORAL_CAPSULE | Freq: Two times a day (BID) | ORAL | 0 refills | Status: AC

## 2022-10-19 NOTE — Progress Notes (Signed)
   New Patient Visit   Subjective  Misty [name] is a 51 y.o. female who presents for the following: She has a cyst on the right shoulder x 6 years. It comes and goes. It is not currently painful or draining. She had an I and D 6 years ago when it first appeared. She was treated by PCP in April with Doxy 100mg  x 10 days which helped with inflammation.   The following portions of the chart were reviewed this encounter and updated as appropriate: medications, allergies, medical history  Review of Systems:  No other skin or systemic complaints except as noted in HPI or Assessment and Plan.  Objective  Well appearing patient in no apparent distress; mood and affect are within normal limits.   A focused examination was performed of the following areas: Right shoulder  Relevant exam findings are noted in the Assessment and Plan.    Assessment & Plan   EPIDERMAL INCLUSION CYST Exam: Right post shoulder: indurated area with a small palpable subcutaneous structure  Benign-appearing. Exam most consistent with an epidermal inclusion cyst. Discussed that a cyst is a benign growth that can grow over time and sometimes get irritated or inflamed. Recommend observation if it is not bothersome. Discussed option of surgical excision to remove it if it is growing, symptomatic, or other changes noted. Please call for new or changing lesions so they can be evaluated. Discussed risks associated with surgical excision including keloid type scarring. Doxy 100mg  bid x 5 days as needed for flares # 20. Advised taking at onset of inflammatory symptoms.     Return in about 3 months (around 01/19/2023) for TBSE.  Jaclynn Guarneri, CMA, am acting as scribe for [employer], DO.   Documentation: I have reviewed the above documentation for accuracy and completeness, and I agree with the above.  Langston Reusing, DO

## 2022-10-19 NOTE — Patient Instructions (Addendum)
Thank you for visiting my office today. I appreciate your commitment to addressing your health concerns and am glad we could discuss the condition of your cyst.  Here are the key instructions and recommendations from our consultation:  - Medication: I have prescribed Doxycycline 100 mg, to be taken as one tablet twice a day for five days as needed if the cyst becomes inflamed. This prescription is for 20 tablets, sufficient for two flare-ups. Remember to take this medication with food to avoid stomach upset.  - Observation: Currently, the cyst is not inflamed or causing discomfort. We will continue with a conservative approach, monitoring the cyst without immediate surgical intervention due to the potential for scarring and keloids in the shoulder area.  - Surgical Consideration: We discussed the possibility of surgery to remove the cyst permanently. However, considering the location and the risk of scarring and keloids, we decided it might not be worth pursuing surgery at this time unless the cyst's condition changes significantly.  - Follow-Up: Please keep an eye on the cyst, and if you notice any changes or if it becomes inflamed, start the Doxycycline immediately and contact our office. We can reassess the need for further treatment or potential surgical options.  If you have any questions or if there are any changes in the condition of your cyst, do not hesitate to reach out.         Due to recent changes in healthcare laws, you may see results of your pathology and/or laboratory studies on MyChart before the doctors have had a chance to review them. We understand that in some cases there may be results that are confusing or concerning to you. Please understand that not all results are received at the same time and often the doctors may need to interpret multiple results in order to provide you with the best plan of care or course of treatment. Therefore, we ask that you please give [place] 2  business days to thoroughly review all your results before contacting the office for clarification. Should we see a critical lab result, you will be contacted sooner.   If You Need Anything After Your Visit  If you have any questions or concerns for your doctor, please call our main line at [phone] If no one answers, please leave a voicemail as directed and we will return your call as soon as possible. Messages left after 4 pm will be answered the following business day.   You may also send [place] a message via MyChart. We typically respond to MyChart messages within 1-2 business days.  For prescription refills, please ask your pharmacy to contact our office. Our fax number is [phone].  If you have an urgent issue when the clinic is closed that cannot wait until the next business day, you can page your doctor at the number below.    Please note that while we do our best to be available for urgent issues outside of office hours, we are not available 24/7.   If you have an urgent issue and are unable to reach [place], you may choose to seek medical care at your doctor's office, retail clinic, urgent care center, or emergency room.  If you have a medical emergency, please immediately call 911 or go to the emergency department. In the event of inclement weather, please call our main line at [phone] for an update on the status of any delays or closures.  Dermatology Medication Tips: Please keep the boxes that topical medications come in  in order to help keep track of the instructions about where and how to use these. Pharmacies typically print the medication instructions only on the boxes and not directly on the medication tubes.   If your medication is too expensive, please contact our office at [phone] or send [place] a message through MyChart.   We are unable to tell what your co-pay for medications will be in advance as this is different depending on your insurance coverage. However, we  may be able to find a substitute medication at lower cost or fill out paperwork to get insurance to cover a needed medication.   If a prior authorization is required to get your medication covered by your insurance company, please allow [place] 1-2 business days to complete this process.  Drug prices often vary depending on where the prescription is filled and some pharmacies may offer cheaper prices.  The website www.goodrx.com contains coupons for medications through different pharmacies. The prices here do not account for what the cost may be with help from insurance (it may be cheaper with your insurance), but the website can give you the price if you did not use any insurance.  - You can print the associated coupon and take it with your prescription to the pharmacy.  - You may also stop by our office during regular business hours and pick up a GoodRx coupon card.  - If you need your prescription sent electronically to a different pharmacy, notify our office through [facility] or by phone at [phone]

## 2022-10-28 ENCOUNTER — Ambulatory Visit (INDEPENDENT_AMBULATORY_CARE_PROVIDER_SITE_OTHER): Payer: BC Managed Care – PPO | Admitting: Family Medicine

## 2022-10-28 ENCOUNTER — Encounter: Payer: Self-pay | Admitting: Family Medicine

## 2022-10-28 VITALS — BP 112/78 | HR 65 | Temp 98.6°F | Ht 65.0 in | Wt 157.5 lb

## 2022-10-28 DIAGNOSIS — R0681 Apnea, not elsewhere classified: Secondary | ICD-10-CM | POA: Diagnosis not present

## 2022-10-28 DIAGNOSIS — Z0000 Encounter for general adult medical examination without abnormal findings: Secondary | ICD-10-CM | POA: Diagnosis not present

## 2022-10-28 NOTE — Progress Notes (Signed)
Phone [phone]   Subjective:   Patient is a 52 y.o. female presenting for annual physical.    Chief Complaint  Patient presents with   Annual Exam    CPE Possible sleep apnea due to snoring and not getting restful sleep Need to find gyn for pap   Annual-pap about 3 yrs ago.  Working on finding gyn.  Will sch mamm. Walks 0-3/wk.  Tdap5-7 yrs ago per pt.  Snoring a lot.  Husb said a lot of pauses.  Husb concerned w/sleep apnea.  Tired during day.  No ha.  Wakes up tired.  Not fall asleep driving nor passenger.  Occ while watching tv.   3:00 slump.  Would nap if could. Makes self get up at times so not fall asleep.  Struggling w/wt-tried app, etc.  Working on diet.   See problem oriented charting- ROS- ROS: Gen: no fever, chills  Skin: cyst-saw derm-abx if starting again.  Not rec removing ENT: no ear pain, ear drainage, nasal congestion, rhinorrhea, sinus pressure, sore throat Eyes: no blurry vision, double vision Resp: no cough, wheeze,SOB CV: no CP, palpitations, LE edema,  GI: no heartburn, n/v/d/c, abd pain GU: no dysuria, urgency, frequency, hematuria.  Some urgency. MSK: chronic knees/ankles esp in am.  Feel tight when in process of waking up.  Neuro: no dizziness, headache, weakness, vertigo Psych: no depression, anxiety, insomnia, SI   The following were reviewed and entered/updated in epic: Past Medical History:  Diagnosis Date   Allergy    Anxiety    There are no problems to display for this patient.  Past Surgical History:  Procedure Laterality Date   CESAREAN SECTION  2005    Family History  Problem Relation Age of Onset   Hearing loss Mother    Cancer Mother 21       breast, skin   Breast cancer Mother    Hypertension Mother    Healthy Father    Hearing loss Maternal Grandmother    Arthritis Maternal Grandmother    Early death Maternal Grandfather    Heart attack Maternal Grandfather    COPD Paternal Grandmother    Stroke Paternal Grandmother  65   Early death Paternal Grandmother    COPD Paternal Grandfather     Medications- reviewed and updated Current Outpatient Medications  Medication Sig Dispense Refill   Ascorbic Acid (VITAMIN C PO) Take 1 tablet by mouth daily.     ASHWAGANDHA PO Take 2 tablets by mouth daily. 1-2 tablets daily     EVENING PRIMROSE OIL PO Take 2 tablets by mouth daily.     ibuprofen (ADVIL) 200 MG tablet Take 200 mg by mouth every 6 (six) hours as needed.     MAGNESIUM PO Take 1 tablet by mouth daily.     Multiple Vitamin (MULTIVITAMIN) tablet Take 1 tablet by mouth daily.     VITAMIN D PO Take 1 tablet by mouth daily.     doxycycline (VIBRAMYCIN) 100 MG capsule Take 1 capsule (100 mg total) by mouth 2 (two) times daily. Take one pill twice daily with food for 5 days at first sign of flare (Patient not taking: Reported on 10/28/2022) 20 capsule 0   No current facility-administered medications for this visit.    Allergies-reviewed and updated No Known Allergies  Social History   Social History Narrative   Radio producer   Objective  Objective:  BP 112/78   Pulse 65   Temp 98.6 F (37 C) (Temporal)  Ht 5\' 5"  (1.651 m)   Wt 157 lb 8 oz (71.4 kg)   SpO2 99%   BMI 26.21 kg/m  Physical Exam  Gen: WDWN NAD HEENT: NCAT, conjunctiva not injected, sclera nonicteric TM WNL B, OP moist, no exudates  NECK:  supple, no thyromegaly, no nodes, no carotid bruits CARDIAC: RRR, S1S2+, no murmur. DP 2+B LUNGS: CTAB. No wheezes ABDOMEN:  BS+, soft, NTND, No HSM, no masses EXT:  no edema MSK: no gross abnormalities. MS 5/5 all 4 NEURO: A&O x3.  CN II-XII intact.  PSYCH: normal mood. Good eye contact     Assessment and Plan   Health Maintenance counseling: 1. Anticipatory guidance: Patient counseled regarding regular dental exams q6 months, eye exams,  avoiding smoking and second hand smoke, limiting alcohol to 1 beverage per day, no illicit drugs.   2. Risk factor reduction:   Advised patient of need for regular exercise and diet rich and fruits and vegetables to reduce risk of heart attack and stroke. Exercise- encouraged.  Wt Readings from Last 3 Encounters:  10/28/22 157 lb 8 oz (71.4 kg)  08/10/22 158 lb (71.7 kg)  07/21/22 157 lb 12.8 oz (71.6 kg)   3. Immunizations/screenings/ancillary studies  There is no immunization history on file for this patient. Health Maintenance Due  Topic Date Due   DTaP/Tdap/Td (1 - Tdap) Never done    4. Cervical cancer screening- pt will sch 5. Breast cancer screening-  mammogram pt will sch 6. Colon cancer screening - UTD 7. Skin cancer screening- advised regular sunscreen use. Denies worrisome, changing, or new skin lesions.  8. Birth control/STD check- n/a 9. Osteoporosis screening- n/a 10. Smoking associated screening - non smoker  Wellness examination  Apnea -     Ambulatory referral to Neurology   Annual-antic guidance.  Get mamm/pap sch.  Get shingrix(declined today as plans for weekend). Encouraged exercise Witnessed apneas-refer neuro for poss sleep study  Recommended follow up: Return in about 1 year (around 10/28/2023) for annual physical.  Lab/Order associations:na fasting  Angelena Sole, MD

## 2022-10-28 NOTE — Patient Instructions (Addendum)
It was very nice to see you today!  Stretches, exercise.  Low sugar.  Glucosamine.  Tumeric/ginger.  Get shingles shot  Psychiatrist Medicine at [facility]  [address] on the 1st floor Phone number [phone]    PLEASE NOTE:  If you had any lab tests please let us know if you have not heard back within a few days. You may see your results on MyChart before we have a chance to review them but we will give you a call once they are reviewed by [place]. If we ordered any referrals today, please let us know if you have not heard from their office within the next week.   Please try these tips to maintain a healthy lifestyle:  Eat most of your calories during the day when you are active. Eliminate processed foods including packaged sweets (pies, cakes, cookies), reduce intake of potatoes, white bread, white pasta, and white rice. Look for whole grain options, oat flour or almond flour.  Each meal should contain half fruits/vegetables, one quarter protein, and one quarter carbs (no bigger than a computer mouse).  Cut down on sweet beverages. This includes juice, soda, and sweet tea. Also watch fruit intake, though this is a healthier sweet option, it still contains natural sugar! Limit to 3 servings daily.  Drink at least 1 glass of water with each meal and aim for at least 8 glasses per day  Exercise at least 150 minutes every week.

## 2022-12-14 ENCOUNTER — Ambulatory Visit (INDEPENDENT_AMBULATORY_CARE_PROVIDER_SITE_OTHER): Payer: BC Managed Care – PPO | Admitting: Neurology

## 2022-12-14 ENCOUNTER — Encounter: Payer: Self-pay | Admitting: Neurology

## 2022-12-14 ENCOUNTER — Institutional Professional Consult (permissible substitution): Payer: BC Managed Care – PPO | Admitting: Neurology

## 2022-12-14 VITALS — BP 137/86 | HR 59 | Ht 65.0 in | Wt 161.4 lb

## 2022-12-14 DIAGNOSIS — G2581 Restless legs syndrome: Secondary | ICD-10-CM | POA: Diagnosis not present

## 2022-12-14 DIAGNOSIS — E663 Overweight: Secondary | ICD-10-CM | POA: Diagnosis not present

## 2022-12-14 DIAGNOSIS — G4719 Other hypersomnia: Secondary | ICD-10-CM

## 2022-12-14 DIAGNOSIS — G479 Sleep disorder, unspecified: Secondary | ICD-10-CM | POA: Diagnosis not present

## 2022-12-14 DIAGNOSIS — Z9189 Other specified personal risk factors, not elsewhere classified: Secondary | ICD-10-CM

## 2022-12-14 DIAGNOSIS — G4700 Insomnia, unspecified: Secondary | ICD-10-CM | POA: Diagnosis not present

## 2022-12-14 DIAGNOSIS — R635 Abnormal weight gain: Secondary | ICD-10-CM

## 2022-12-14 DIAGNOSIS — R0683 Snoring: Secondary | ICD-10-CM | POA: Diagnosis not present

## 2022-12-14 NOTE — Patient Instructions (Signed)

## 2022-12-14 NOTE — Progress Notes (Signed)
Subjective:    Misty Misty: Misty Misty is a 52 y.o. female.  HPI    Huston Foley, MD, PhD [facility] [address], [address] [address] [place], [place] [address]  Dear Dr. Ruthine Dose,  I saw your Misty, Misty Misty, upon your kind request in my sleep clinic today for initial consultation of her sleep disorder, in particular, concern for underlying obstructive sleep apnea.  Misty Misty is unaccompanied today.  As you know, Misty Misty is a 52 year old female with an underlying benign medical history, who reports snoring and excessive daytime somnolence as well as witnessed apneas per husband's report.  Her Epworth sleepiness score is 15 out of 24, fatigue severity score is 41 out of 63.  I reviewed your office note from 10/28/2022.  She does not wake up rested.  She is a light sleeper.  Symptoms have become worse in Misty recent few years since she has been.  Postmenopausal.  She lives with her husband, she has 1 son who just started college in [place].  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  She drinks caffeine in Misty form of coffee about 3 cups/day on average, usually in Misty morning.  She has intermittent restless leg symptoms which sometimes keep her up at night, it does help to move her legs.  She has not been treated for RLS before.  In Misty recent couple of years she has gained about 15 to 20 pounds.  She has tried over-Misty-counter medication to help her sleep including melatonin and valerian, she did not have any sustained results from melatonin or valerian.  She is trying a supplement that contains magnesium, kava and L-theanine which helps a little bit.  Bedtime is generally around 11.  She does not watch TV in her bedroom.  Rise time is somewhere between 6:30 AM and 7 AM.  She has no nightly nocturia or recurrent nocturnal or morning headaches.  She feels stiff in her legs and body first thing in Misty morning.  She has woken up very occasionally  with a sense of gasping for air.  Snoring has been worsening and has been disturbing to her husband.  They have 2 cats in Misty household.  She drinks alcohol up to 5 times a week, she is a non-smoker.  Her Past Medical History Is Significant For: Past Medical History:  Diagnosis Date   Allergy    Anxiety     Her Past Surgical History Is Significant For: Past Surgical History:  Procedure Laterality Date   CESAREAN SECTION  2005    Her Family History Is Significant For: Family History  Problem Relation Age of Onset   Hearing loss Mother    Cancer Mother 58       breast, skin   Breast cancer Mother    Hypertension Mother    Healthy Father    Hearing loss Maternal Grandmother    Arthritis Maternal Grandmother    Early death Maternal Grandfather    Heart attack Maternal Grandfather    COPD Paternal Grandmother    Stroke Paternal Grandmother 66   Early death Paternal Grandmother    COPD Paternal Grandfather    Sleep apnea Neg Hx     Her Social History Is Significant For: Social History   Socioeconomic History   Marital status: Married    Spouse name: Not on file   Number of children: 1   Years of education: Not on file   Highest  education level: Not on file  Occupational History   Not on file  Tobacco Use   Smoking status: Never   Smokeless tobacco: Never  Vaping Use   Vaping status: Never Used  Substance and Sexual Activity   Alcohol use: Yes    Alcohol/week: 10.0 standard drinks of alcohol    Types: 10 Glasses of wine per week    Comment: occasional-weekends   Drug use: Never   Sexual activity: Yes    Birth control/protection: None  Other Topics Concern   Not on file  Social History Narrative   Radio producer   Social Determinants of Health   Financial Resource Strain: Not on file  Food Insecurity: Not on file  Transportation Needs: Not on file  Physical Activity: Not on file  Stress: Not on file  Social Connections: Not on file     Her Allergies Are:  No Known Allergies:   Her Current Medications Are:  Outpatient Encounter Medications as of 12/14/2022  Medication Sig   Ascorbic Acid (VITAMIN C PO) Take 1 tablet by mouth daily.   ASHWAGANDHA PO Take 2 tablets by mouth daily. 1-2 tablets daily   ibuprofen (ADVIL) 200 MG tablet Take 200 mg by mouth as needed.   MAGNESIUM PO Take 1 tablet by mouth daily.   Multiple Vitamin (MULTIVITAMIN) tablet Take 1 tablet by mouth daily.   VITAMIN D PO Take 1 tablet by mouth daily.   doxycycline (VIBRAMYCIN) 100 MG capsule Take 1 capsule (100 mg total) by mouth 2 (two) times daily. Take one pill twice daily with food for 5 days at first sign of flare   EVENING PRIMROSE OIL PO Take 2 tablets by mouth daily.   No facility-administered encounter medications on file as of 12/14/2022.  :   Review of Systems:  Out of a complete 14 point review of systems, all are reviewed and negative with Misty exception of these symptoms as listed below:   Review of Systems  Neurological:        Pt here for sleep consult Pt snores,fatigue Pt denies headaches,hypertension,sleep study, CPAP machine   ESS:15 FSS:41     Objective:  Neurological Exam  Physical Exam Physical Examination:   Vitals:   12/14/22 0854  BP: 137/86  Pulse: (!) 59    General Examination: Misty Misty is a very pleasant 52 y.o. female in no acute distress. She appears well-developed and well-nourished and well groomed.   HEENT: Normocephalic, atraumatic, pupils are equal, round and reactive to light, extraocular tracking is good without limitation to gaze excursion or nystagmus noted. Hearing is grossly intact. Face is symmetric with normal facial animation. Speech is clear with no dysarthria noted. There is no hypophonia. There is no lip, neck/head, jaw or voice tremor. Neck is supple with full range of passive and active motion. There are no carotid bruits on auscultation. Oropharynx exam reveals: no significant mouth  dryness, good dental hygiene and mild airway crowding, due to smaller airway entry, tonsils small, Mallampati is class II. Tongue protrudes centrally and palate elevates symmetrically. Neck size 13 5/8 in. No significant overbite, slightly skewed teeth alignment.   Chest: Clear to auscultation without wheezing, rhonchi or crackles noted.  Heart: S1+S2+0, regular and normal without murmurs, rubs or gallops noted.   Abdomen: Soft, non-tender and non-distended.  Extremities: There is no pitting edema in Misty distal lower extremities bilaterally.   Skin: Warm and dry without trophic changes noted.   Musculoskeletal: exam reveals no obvious joint deformities.  Neurologically:  Mental status: Misty Misty is awake, alert and oriented in all 4 spheres. Her immediate and remote memory, attention, language skills and fund of knowledge are appropriate. There is no evidence of aphasia, agnosia, apraxia or anomia. Speech is clear with normal prosody and enunciation. Thought process is linear. Mood is normal and affect is normal.  Cranial nerves II - XII are as described above under HEENT exam.  Motor exam: Normal bulk, strength and tone is noted. There is no obvious action or resting tremor.  Fine motor skills and coordination: grossly intact.  Cerebellar testing: No dysmetria or intention tremor. There is no truncal or gait ataxia.  Sensory exam: intact to light touch in Misty upper and lower extremities.  Gait, station and balance: She stands easily. No veering to one side is noted. No leaning to one side is noted. Posture is age-appropriate and stance is narrow based. Gait shows normal stride length and normal pace. No problems turning are noted.   Assessment and Plan:  In summary, Misty Misty is a very pleasant 52 y.o.-year old female with an underlying benign medical history, whose history and physical exam are concerning for sleep disordered breathing, particularly obstructive sleep apnea (OSA). A  laboratory attended sleep study is typically considered "gold standard" for evaluation of sleep disordered breathing. Plus, she endorses RLS symptoms and will proceed with a lab study to look for PLMs (periodic limb movements of sleep). In addition, I would like to do some blood work today, including iron studies, and TSH, B12 level. Labs from 10/2021 were reviewed and benign at Misty time.  I had a long chat with Misty Misty about my findings and Misty diagnosis of sleep apnea, particularly OSA, its prognosis and treatment options. We talked about medical/conservative treatments, surgical interventions and non-pharmacological approaches for symptom control. I explained, in particular, Misty risks and ramifications of untreated moderate to severe OSA, especially with respect to developing cardiovascular disease down Misty road, including congestive heart failure (CHF), difficult to treat hypertension, cardiac arrhythmias (particularly A-fib), neurovascular complications including TIA, stroke and dementia. Even type 2 diabetes has, in part, been linked to untreated OSA. Symptoms of untreated OSA may include (but may not be limited to) daytime sleepiness, nocturia (i.e. frequent nighttime urination), memory problems, mood irritability and suboptimally controlled or worsening mood disorder such as depression and/or anxiety, lack of energy, lack of motivation, physical discomfort, as well as recurrent headaches, especially morning or nocturnal headaches. We talked about Misty importance of maintaining a healthy lifestyle and striving for healthy weight. In addition, we talked about Misty importance of striving for and maintaining good sleep hygiene. I recommended a sleep study at this time. I outlined Misty differences between a laboratory attended sleep study which is considered more comprehensive and accurate over Misty option of a home sleep test (HST); Misty latter may lead to underestimation of sleep disordered breathing in some  instances and does not help with diagnosing upper airway resistance syndrome and is not accurate enough to diagnose primary central sleep apnea typically. I outlined possible surgical and non-surgical treatment options of OSA, including Misty use of a positive airway pressure (PAP) device (i.e. CPAP, AutoPAP/APAP or BiPAP in certain circumstances), a custom-made dental device (aka oral appliance, which would require a referral to a specialist dentist or orthodontist typically, and is generally speaking not considered for patients with full dentures or edentulous state), upper airway surgical options, such as traditional UPPP (which is not considered a first-line treatment) or Misty  Inspire device (hypoglossal nerve stimulator, which would involve a referral for consultation with an ENT surgeon, after careful selection, following inclusion criteria - also not first-line treatment). I explained Misty PAP treatment option to Misty Misty in detail, as this is generally considered first-line treatment.  Misty Misty indicated that she would be willing to try PAP therapy, if Misty need arises. I explained Misty importance of being compliant with PAP treatment, not only for insurance purposes but primarily to improve Misty's symptoms symptoms, and for Misty Misty's long term health benefit, including to reduce Her cardiovascular risks longer-term.    We will pick up our discussion about Misty next steps and treatment options after testing.    We may consider symptomatic medication for RLS.   We will keep her posted as to Misty test results by phone call and/or MyChart messaging where possible.  We will plan to follow-up in sleep clinic accordingly as well.  I answered all her questions today and Misty Misty was in agreement.   I encouraged her to call with any interim questions, concerns, problems or updates or email [place] through MyChart.  Generally speaking, sleep test authorizations may take up to 2 weeks, sometimes less,  sometimes longer, Misty Misty is encouraged to get in touch with [place] if they do not hear back from Misty sleep lab staff directly within Misty next 2 weeks.  Thank you very much for allowing me to participate in Misty care of this nice Misty. If I can be of any further assistance to you please do not hesitate to call me at [phone].  Sincerely,   Huston Foley, MD, PhD

## 2022-12-15 LAB — IRON AND TIBC
Iron Saturation: 33 % (ref 15–55)
Iron: 111 ug/dL (ref 27–159)
Total Iron Binding Capacity: 338 ug/dL (ref 250–450)
UIBC: 227 ug/dL (ref 131–425)

## 2022-12-15 LAB — B12 AND FOLATE PANEL
Folate: 5.9 ng/mL (ref >3.0)
Vitamin B-12: 509 pg/mL (ref 232–1245)

## 2022-12-15 LAB — FERRITIN: Ferritin: 155 ng/mL — ABNORMAL HIGH (ref 15–150)

## 2022-12-15 LAB — TSH: TSH: 2.34 u[IU]/mL (ref 0.450–4.500)

## 2022-12-26 ENCOUNTER — Telehealth: Payer: Self-pay | Admitting: Neurology

## 2022-12-26 NOTE — Telephone Encounter (Signed)
[employer]- [employer] no auth req spoke to [place] ref # [id number].  Patient is scheduled at [facility] for 01/23/23 at 9 pm.  Mailed packet to the patient.

## 2023-01-16 ENCOUNTER — Other Ambulatory Visit: Payer: Self-pay | Admitting: Family Medicine

## 2023-01-16 DIAGNOSIS — Z1231 Encounter for screening mammogram for malignant neoplasm of breast: Secondary | ICD-10-CM

## 2023-01-19 ENCOUNTER — Ambulatory Visit: Payer: BC Managed Care – PPO | Admitting: Dermatology

## 2023-01-19 VITALS — BP 120/81

## 2023-01-19 DIAGNOSIS — Z1283 Encounter for screening for malignant neoplasm of skin: Secondary | ICD-10-CM

## 2023-01-19 DIAGNOSIS — D1801 Hemangioma of skin and subcutaneous tissue: Secondary | ICD-10-CM

## 2023-01-19 DIAGNOSIS — L814 Other melanin hyperpigmentation: Secondary | ICD-10-CM

## 2023-01-19 DIAGNOSIS — D229 Melanocytic nevi, unspecified: Secondary | ICD-10-CM

## 2023-01-19 DIAGNOSIS — L578 Other skin changes due to chronic exposure to nonionizing radiation: Secondary | ICD-10-CM | POA: Diagnosis not present

## 2023-01-19 DIAGNOSIS — L821 Other seborrheic keratosis: Secondary | ICD-10-CM

## 2023-01-19 NOTE — Progress Notes (Signed)
   Follow-Up Visit   Subjective  Misty [name] is a 52 y.o. female who presents for the following: Skin Cancer Screening and Full Body Skin Exam - History of skin cancer of right shoulder and her mother has a history of skin cancer.  The patient presents for Total-Body Skin Exam (TBSE) for skin cancer screening and mole check. The patient has spots, moles and lesions to be evaluated, some may be new or changing and the patient may have concern these could be cancer.    The following portions of the chart were reviewed this encounter and updated as appropriate: medications, allergies, medical history  Review of Systems:  No other skin or systemic complaints except as noted in HPI or Assessment and Plan.  Objective  Well appearing patient in no apparent distress; mood and affect are within normal limits.  A full examination was performed including scalp, head, eyes, ears, nose, lips, neck, chest, axillae, abdomen, back, buttocks, bilateral upper extremities, bilateral lower extremities, hands, feet, fingers, toes, fingernails, and toenails. All findings within normal limits unless otherwise noted below.   Relevant physical exam findings are noted in the Assessment and Plan.    Assessment & Plan   SKIN CANCER SCREENING PERFORMED TODAY.  ACTINIC DAMAGE - MILD - Chronic condition, secondary to cumulative UV/sun exposure - diffuse scaly erythematous macules with underlying dyspigmentation - Recommend daily broad spectrum sunscreen SPF 30+ to sun-exposed areas, reapply every 2 hours as needed.  - Staying in the shade or wearing long sleeves, sun glasses (UVA+UVB protection) and wide brim hats (4-inch brim around the entire circumference of the hat) are also recommended for sun protection.  - Call for new or changing lesions.  LENTIGINES, SEBORRHEIC KERATOSES, HEMANGIOMAS - Benign normal skin lesions - Benign-appearing - Call for any changes  MELANOCYTIC NEVI - Tan-brown and/or  pink-flesh-colored symmetric macules and papules - Benign appearing on exam today - Observation - Call clinic for new or changing moles - Recommend daily use of broad spectrum spf 30+ sunscreen to sun-exposed areas.   HISTORY OF SKIN CANCER - Clear. Observe for recurrence.  - Call clinic for new or changing lesions.   - Recommend regular skin exams, daily broad-spectrum spf 30+ sunscreen use, and photoprotection.          Return in about 1 year (around 01/19/2024) for TBSE.  I, Joanie Coddington, CMA, am acting as scribe for [employer], DO .   Documentation: I have reviewed the above documentation for accuracy and completeness, and I agree with the above.  Langston Reusing, DO

## 2023-01-19 NOTE — Patient Instructions (Addendum)

## 2023-01-23 ENCOUNTER — Encounter: Payer: Self-pay | Admitting: Dermatology

## 2023-01-23 ENCOUNTER — Ambulatory Visit (INDEPENDENT_AMBULATORY_CARE_PROVIDER_SITE_OTHER): Payer: BC Managed Care – PPO | Admitting: Neurology

## 2023-01-23 DIAGNOSIS — E663 Overweight: Secondary | ICD-10-CM

## 2023-01-23 DIAGNOSIS — G4719 Other hypersomnia: Secondary | ICD-10-CM

## 2023-01-23 DIAGNOSIS — R635 Abnormal weight gain: Secondary | ICD-10-CM

## 2023-01-23 DIAGNOSIS — Z9189 Other specified personal risk factors, not elsewhere classified: Secondary | ICD-10-CM

## 2023-01-23 DIAGNOSIS — G4700 Insomnia, unspecified: Secondary | ICD-10-CM

## 2023-01-23 DIAGNOSIS — G2581 Restless legs syndrome: Secondary | ICD-10-CM

## 2023-01-23 DIAGNOSIS — R0683 Snoring: Secondary | ICD-10-CM | POA: Diagnosis not present

## 2023-01-23 DIAGNOSIS — G4720 Circadian rhythm sleep disorder, unspecified type: Secondary | ICD-10-CM

## 2023-01-23 DIAGNOSIS — G479 Sleep disorder, unspecified: Secondary | ICD-10-CM

## 2023-01-25 NOTE — Procedures (Signed)
Physician Interpretation:     [facility] POLYSOMNOGRAPHY  INTERPRETATION REPORT   STUDY DATE:  01/23/2023     PATIENT NAME:  Misty [name]         DATE OF BIRTH:  1970-08-11  PATIENT ID:  [id number]    TYPE OF STUDY:  PSG  READING PHYSICIAN: Huston Foley, MD, PhD   SCORING TECHNICIAN: Margaretann Loveless, RPSGT   Referred by: Jeani Sow, MD  ? History and Indication for Testing: 52 year old female with an underlying benign medical history, who reports snoring and excessive daytime somnolence as well as witnessed apneas per husband's report. Her Epworth sleepiness score is 15 out of 24, fatigue severity score is 41 out of 63.  Height: 65 in Weight: 161 lb (BMI 26) Neck Size: 14 in    MEDICATIONS: Vitamin C, Ashwagandha, Advil, Magnesium, Multivitamin, Doxycycline, Evening Primerose Oil   TECHNICAL DESCRIPTION: A registered sleep technologist  was in attendance for the duration of the recording.  Data collection, scoring, video monitoring, and reporting were performed in compliance with the AASM Manual for the Scoring of Sleep and Associated Events; (Hypopnea is scored based on the criteria listed in Section VIII D. 1b in the AASM Manual V2.6 using a 4% oxygen desaturation rule or Hypopnea is scored based on the criteria listed in Section VIII D. 1a in the AASM Manual V2.6 using 3% oxygen desaturation and /or arousal rule).   SLEEP CONTINUITY AND SLEEP ARCHITECTURE:  Lights-out was at 22:09: and lights-on at  04:58:, with a total recording time of 6 hours, 48.5. Total sleep time ( TST) was 263.5 minutes with a decreased sleep efficiency at 64.5%. There was  10.2% REM sleep.    BODY POSITION:  TST was divided  between the following sleep positions: 6.3% supine;  93.7% lateral;  0% prone. Duration of total sleep and percent of total sleep in their respective position is as follows: supine 16 minutes (6%), non-supine 247 minutes (94%); right 126 minutes (48%),  left 120 minutes (46%), and prone 00 minutes (0%).  Total supine REM sleep time was 00 minutes (0% of total REM sleep).  Sleep latency was increased at 52.5 minutes.  REM sleep latency was increased at 164.0 minutes. Of the total sleep time, the percentage of stage N1 sleep was 2.7%, stage N2 sleep was 63%, which is increased, stage N3 sleep was 23.9%, which is increased, and REM sleep was 10.2%, which is reduced.  Wake after sleep onset (WASO) time accounted for 92.5 minutes with sleep fragmentation noted, ranging from minimal to moderate.   RESPIRATORY MONITORING:   Based on CMS criteria (using a 4% oxygen desaturation rule for scoring hypopneas), there were 2 apneas (2 obstructive; 0 central; 0 mixed), and 7 hypopneas.  Apnea index was 0.5. Hypopnea index was 1.6. The apnea-hypopnea index was 2.0 overall (0.0 supine, 4 non-supine; 4.4 REM, 0.0 supine REM).  There were 0 respiratory effort-related arousals (RERAs).  The RERA index was 0 events/h. Total respiratory disturbance index (RDI) was 2.0 events/h. RDI results showed: supine RDI  0.0 /h; non-supine RDI 2.2 /h; REM RDI 4.4 /h, supine REM RDI 0.0 /h.   Based on AASM criteria (using a 3% oxygen desaturation and /or arousal rule for scoring hypopneas), there were 2 apneas (2 obstructive; 0 central; 0 mixed), and 18 hypopneas. Apnea index was 0.5. Hypopnea index was 4.1. The apnea-hypopnea index was 4.6/hour overall (0.0 supine, 9 non-supine; 8.9 REM, 0.0 supine REM).  There were 0  respiratory effort-related arousals (RERAs).  The RERA index was 0 events/h. Total respiratory disturbance index (RDI) was 4.6 events/h. RDI results showed: supine RDI  0.0 /h; non-supine RDI 4.9 /h; REM RDI 8.9 /h, supine REM RDI 0.0 /h.    OXIMETRY: Oxyhemoglobin Saturation Nadir during sleep was at  86% from a mean of 97%.  Of the Total sleep time (TST)   hypoxemia (=<88%) was present for  0.0 minutes, or 0.0% of total sleep time.   LIMB MOVEMENTS: There were 0  periodic limb movements of sleep (0.0/hr), of which 0 (0.0/hr) were associated with an arousal.   AROUSAL: There were 54 arousals in total, for an arousal index of 12 arousals/hour.  Of these, 6 were identified as respiratory-related arousals (1 /h), 0 were PLM-related arousals (0 /h), and 69 were non-specific arousals (16 /h).   EEG: Review of the EEG showed no abnormal electrical discharges and symmetrical bihemispheric findings.    EKG: The EKG revealed normal sinus rhythm (NSR). The average heart rate during sleep was 59 bpm.  AUDIO/VIDEO REVIEW: The audio and video review did not show any abnormal or unusual behaviors, movements, phonations or vocalizations. The patient took no restroom breaks.  No significant/audible snoring was detected.  POST-STUDY QUESTIONNAIRE: Post study, the patient indicated, that sleep was the same as usual.   IMPRESSION:  1. Dysfunctions associated with sleep stages or arousal from sleep  RECOMMENDATIONS:  1. This study does not demonstrate any significant obstructive or central sleep disordered breathing with an AHI of less than 5/hour - her total AHI was borderline at 4.6/h, O2 nadir 86% (briefly).  No significant snoring was noted. Treatment with a positive airway pressure device, such as CPAP or autoPAP is not indicated.  Some degree of weight loss and avoiding the supine sleep position may aid in reducing snoring noted at home. 2. This study shows sleep fragmentation and abnormal sleep stage percentages; these are nonspecific findings and per se do not signify an intrinsic sleep disorder or a cause for the patient's sleep-related symptoms. Causes include (but are not limited to) the first night effect of the sleep study, circadian rhythm disturbances, medication effect or an underlying mood disorder or medical problem.  3. The patient should be cautioned not to drive, work at heights, or operate dangerous or heavy equipment when tired or sleepy. Review and  reiteration of good sleep hygiene measures should be pursued with any patient. 4. The patient will be advised to follow up with the referring provider, who will be notified of the test results.    I certify that I have reviewed the entire raw data recording prior to the issuance of this report in accordance with the Standards of Accreditation of the American Academy of Sleep Medicine (AASM).  Huston Foley, MD, PhD Medical Director, [facility]) Diplomat, ABPN (Neurology and Sleep)               Technical Report:   General Information  Name: Misty [name], Misty [name] BMI: 26.79 Physician: Huston Foley, MD  ID: [id number] Height: 65.0 in Technician: Margaretann Loveless, RPSGT  Sex: Female Weight: 161.0 lb Record: [id number]  Age: 72 [April 24, 1970] Date: 01/23/2023    Medical & Medication History    52 year old female with an underlying benign medical history, who reports snoring and excessive daytime somnolence as well as witnessed apneas per husband's report. She does not wake up rested. She is a light sleeper. Symptoms have become worse in the recent few years  since she has been postmenopausal. She has tried over-the-counter medication to help her sleep including melatonin and valerian, she did not have any sustained results from melatonin or valerian. She is trying a supplement that contains magnesium, kava and L-theanine which helps a little bit. She has woken up very occasionally with a sense of gasping for air. Snoring has been worsening and has been disturbing to her husband. T Vitamin C, Ashwagandha, Advil, Magnesium, Multivitamin, Doxycycline, Evening Primerose Oil   Sleep Disorder      Comments   The patient came into the lab for a PSG. The patient had no restroom breaks. EKG kept in NSR. No snoring. All sleep stages witnessed. Respiratory events scored with a 3% desat. Slept supine and lateral. The patient had a few periods of WASO.    Lights out:  10:09:38 PM Lights on: 04:58:01 AM   Time Total Supine Side Prone Upright  Recording (TRT) 6h 48.3m 1h 20.74m 5h 28.43m 0h 0.36m 0h 0.47m  Sleep (TST) 4h 23.39m 0h 16.65m 4h 7.16m 0h 0.70m 0h 0.44m   Latency N1 N2 N3 REM Onset Per. Slp. Eff.  Actual 0h 0.83m 0h 0.37m 0h 26.33m 2h 44.11m 0h 52.19m 1h 7.68m 64.50%   Stg Dur Wake N1 N2 N3 REM  Total 145.0 7.0 166.5 63.0 27.0  Supine 63.5 1.5 15.0 0.0 0.0  Side 81.5 5.5 151.5 63.0 27.0  Prone 0.0 0.0 0.0 0.0 0.0  Upright 0.0 0.0 0.0 0.0 0.0   Stg % Wake N1 N2 N3 REM  Total 35.5 2.7 63.2 23.9 10.2  Supine 15.5 0.6 5.7 0.0 0.0  Side 20.0 2.1 57.5 23.9 10.2  Prone 0.0 0.0 0.0 0.0 0.0  Upright 0.0 0.0 0.0 0.0 0.0     Apnea Summary Sub Supine Side Prone Upright  Total 2 Total 2 0 2 0 0    REM 0 0 0 0 0    NREM 2 0 2 0 0  Obs 2 REM 0 0 0 0 0    NREM 2 0 2 0 0  Mix 0 REM 0 0 0 0 0    NREM 0 0 0 0 0  Cen 0 REM 0 0 0 0 0    NREM 0 0 0 0 0   Rera Summary Sub Supine Side Prone Upright  Total 0 Total 0 0 0 0 0    REM 0 0 0 0 0    NREM 0 0 0 0 0   Hypopnea Summary Sub Supine Side Prone Upright  Total 18 Total 18 0 18 0 0    REM 4 0 4 0 0    NREM 14 0 14 0 0   4% Hypopnea Summary Sub Supine Side Prone Upright  Total (4%) 7 Total 7 0 7 0 0    REM 2 0 2 0 0    NREM 5 0 5 0 0     AHI Total Obs Mix Cen  4.55 Apnea 0.46 0.46 0.00 0.00   Hypopnea 4.10 -- -- --  2.05 Hypopnea (4%) 1.59 -- -- --    Total Supine Side Prone Upright  Position AHI 4.55 0.00 4.86 0.00 0.00  REM AHI 8.89   NREM AHI 4.06   Position RDI 4.55 0.00 4.86 0.00 0.00  REM RDI 8.89   NREM RDI 4.06    4% Hypopnea Total Supine Side Prone Upright  Position AHI (4%) 2.05 0.00 2.19 0.00 0.00  REM AHI (4%) 4.44   NREM AHI (  4%) 1.78   Position RDI (4%) 2.05 0.00 2.19 0.00 0.00  REM RDI (4%) 4.44   NREM RDI (4%) 1.78    Desaturation Information Threshold: 2% <100% <90% <80% <70% <60% <50% <40%  Supine 31.0 0.0 0.0 0.0 0.0 0.0 0.0  Side 104.0 2.0 1.0 0.0 0.0 0.0 0.0  Prone  0.0 0.0 0.0 0.0 0.0 0.0 0.0  Upright 0.0 0.0 0.0 0.0 0.0 0.0 0.0  Total 135.0 2.0 1.0 0.0 0.0 0.0 0.0  Index 22.8 0.3 0.2 0.0 0.0 0.0 0.0   Threshold: 3% <100% <90% <80% <70% <60% <50% <40%  Supine 6.0 0.0 0.0 0.0 0.0 0.0 0.0  Side 23.0 2.0 1.0 0.0 0.0 0.0 0.0  Prone 0.0 0.0 0.0 0.0 0.0 0.0 0.0  Upright 0.0 0.0 0.0 0.0 0.0 0.0 0.0  Total 29.0 2.0 1.0 0.0 0.0 0.0 0.0  Index 4.9 0.3 0.2 0.0 0.0 0.0 0.0   Threshold: 4% <100% <90% <80% <70% <60% <50% <40%  Supine 4.0 0.0 0.0 0.0 0.0 0.0 0.0  Side 11.0 2.0 1.0 0.0 0.0 0.0 0.0  Prone 0.0 0.0 0.0 0.0 0.0 0.0 0.0  Upright 0.0 0.0 0.0 0.0 0.0 0.0 0.0  Total 15.0 2.0 1.0 0.0 0.0 0.0 0.0  Index 2.5 0.3 0.2 0.0 0.0 0.0 0.0   Threshold: 3% <100% <90% <80% <70% <60% <50% <40%  Supine 6 0 0 0 0 0 0  Side 23 2 1  0 0 0 0  Prone 0 0 0 0 0 0 0  Upright 0 0 0 0 0 0 0  Total 29 2 1  0 0 0 0   Awakening/Arousal Information # of Awakenings 31  Wake after sleep onset 92.97m  Wake after persistent sleep 87.28m   Arousal Assoc. Arousals Index  Apneas 0 0.0  Hypopneas 6 1.4  Leg Movements 0 0.0  Snore 0 0.0  PTT Arousals 0 0.0  Spontaneous 69 15.7  Total 75 17.1  Leg Movement Information PLMS LMs Index  Total LMs during PLMS 0 0.0  LMs w/ Microarousals 0 0.0   LM LMs Index  w/ Microarousal 0 0.0  w/ Awakening 0 0.0  w/ Resp Event 0 0.0  Spontaneous 0 0.0  Total 0 0.0     Desaturation threshold setting: 3% Minimum desaturation setting: 10 seconds SaO2 nadir: 79% The longest event was a 41 sec obstructive Hypopnea with a minimum SaO2 of 95%. The lowest SaO2 was 94% associated with a 13 sec obstructive Hypopnea. EKG Rates EKG Avg Max Min  Awake 64 92 53  Asleep 59 77 50  EKG Events: Tachycardia

## 2023-01-26 ENCOUNTER — Telehealth: Payer: Self-pay

## 2023-01-26 NOTE — Telephone Encounter (Signed)
Contacted pt, informed her recent sleep study did not show any significant sleep apnea or significant underlying sleep disorder, no significant EKG (heart tracing) changes or EEG (brain wave tracing) changes. Her AHI was 4.6/hour (normal is >5) - and oxygen saturations remained at or above 86% for the night. No significant snoring was noted. Treatment with a positive airway pressure device, such as CPAP or autoPAP is not indicated.  Avoidance of the supine sleep position and some degree of weight loss (she is not obese) may reduce her snoring noted at home. No organic reason for sleep difficulty was found on this test. Patient did achieve all stages of sleep. At this juncture, she can follow up with her PCP and other providers as scheduled/planned. Pt verbally understood results and was appreciative. Advised to call office back with any questions as she had none at this time.

## 2023-01-26 NOTE — Telephone Encounter (Signed)
-----   Message from Huston Foley sent at 01/25/2023  5:45 PM EDT ----- Patient referred by Dr. Ruthine Dose, seen by me on 12/14/22, patient had a diagnostic PSG on 01/22/22.   Please call and notify the patient that the recent sleep study did not show any significant sleep apnea or significant underlying sleep disorder, no significant EKG (heart tracing) changes or EEG (brain wave tracing) changes. Her AHI was 4.6/hour - and oxygen saturations remained at or above 86% for the night. No significant snoring was noted. Treatment with a positive airway pressure device, such as CPAP or autoPAP is not indicated.  Avoidance of the supine sleep position and some degree of weight loss (she is not obese) may reduce her snoring noted at home. No organic reason for sleep difficulty was found on this test. Patient did achieve all stages of sleep. At this juncture, she can follow up with her PCP and other providers as scheduled/planned.   Thanks,  Huston Foley, MD, PhD [facility])

## 2023-02-02 DIAGNOSIS — Z1151 Encounter for screening for human papillomavirus (HPV): Secondary | ICD-10-CM | POA: Diagnosis not present

## 2023-02-02 DIAGNOSIS — Z124 Encounter for screening for malignant neoplasm of cervix: Secondary | ICD-10-CM | POA: Diagnosis not present

## 2023-02-02 DIAGNOSIS — N941 Unspecified dyspareunia: Secondary | ICD-10-CM | POA: Diagnosis not present

## 2023-02-02 DIAGNOSIS — E8941 Symptomatic postprocedural ovarian failure: Secondary | ICD-10-CM | POA: Diagnosis not present

## 2023-02-02 DIAGNOSIS — Z01411 Encounter for gynecological examination (general) (routine) with abnormal findings: Secondary | ICD-10-CM | POA: Diagnosis not present

## 2023-02-02 DIAGNOSIS — N393 Stress incontinence (female) (male): Secondary | ICD-10-CM | POA: Diagnosis not present

## 2023-02-02 DIAGNOSIS — F3289 Other specified depressive episodes: Secondary | ICD-10-CM | POA: Diagnosis not present

## 2023-02-02 DIAGNOSIS — N952 Postmenopausal atrophic vaginitis: Secondary | ICD-10-CM | POA: Diagnosis not present

## 2023-02-02 DIAGNOSIS — Z6826 Body mass index (BMI) 26.0-26.9, adult: Secondary | ICD-10-CM | POA: Diagnosis not present

## 2023-02-07 ENCOUNTER — Ambulatory Visit: Payer: BC Managed Care – PPO

## 2023-04-25 DIAGNOSIS — Z1212 Encounter for screening for malignant neoplasm of rectum: Secondary | ICD-10-CM | POA: Diagnosis not present

## 2023-04-25 DIAGNOSIS — Z1211 Encounter for screening for malignant neoplasm of colon: Secondary | ICD-10-CM | POA: Diagnosis not present

## 2023-05-04 DIAGNOSIS — E8941 Symptomatic postprocedural ovarian failure: Secondary | ICD-10-CM | POA: Diagnosis not present

## 2023-05-06 LAB — COLOGUARD: COLOGUARD: NEGATIVE

## 2023-11-14 DIAGNOSIS — R300 Dysuria: Secondary | ICD-10-CM | POA: Diagnosis not present

## 2024-01-22 ENCOUNTER — Ambulatory Visit: Payer: BC Managed Care – PPO | Admitting: Dermatology

## 2024-01-23 ENCOUNTER — Encounter: Payer: Self-pay | Admitting: Dermatology

## 2024-01-23 ENCOUNTER — Ambulatory Visit: Payer: BC Managed Care – PPO | Admitting: Dermatology

## 2024-01-23 VITALS — BP 125/95 | HR 80

## 2024-01-23 DIAGNOSIS — D229 Melanocytic nevi, unspecified: Secondary | ICD-10-CM

## 2024-01-23 DIAGNOSIS — D1801 Hemangioma of skin and subcutaneous tissue: Secondary | ICD-10-CM

## 2024-01-23 DIAGNOSIS — Z1283 Encounter for screening for malignant neoplasm of skin: Secondary | ICD-10-CM | POA: Diagnosis not present

## 2024-01-23 DIAGNOSIS — W908XXA Exposure to other nonionizing radiation, initial encounter: Secondary | ICD-10-CM

## 2024-01-23 DIAGNOSIS — Z85828 Personal history of other malignant neoplasm of skin: Secondary | ICD-10-CM

## 2024-01-23 DIAGNOSIS — L814 Other melanin hyperpigmentation: Secondary | ICD-10-CM

## 2024-01-23 DIAGNOSIS — L821 Other seborrheic keratosis: Secondary | ICD-10-CM

## 2024-01-23 DIAGNOSIS — L578 Other skin changes due to chronic exposure to nonionizing radiation: Secondary | ICD-10-CM

## 2024-01-23 NOTE — Progress Notes (Unsigned)
   Total Body Skin Exam (TBSE) Visit   Subjective  Misty [name] is a 53 y.o. female who presents for the following: Skin Cancer Screening and Full Body Skin Exam - HX of BCC Right shoulder. Family hx of skin cancer (mother)  Patient presents today for follow up visit for TBSE. Patient was last evaluated on 01/19/2023 . Patient reports medication changes. Those changes have been documented with her current medications.  Patient reports she does not have spots, moles and lesions of concern to be evaluated. Patient reports throughout her lifetime she has had moderate sun exposure. Currently, patient reports if she has excessive sun exposure, she does apply sunscreen and/or wears protective coverings. The following portions of the chart were reviewed this encounter and updated as appropriate: medications, allergies, medical history  Review of Systems:  No other skin or systemic complaints except as noted in HPI or Assessment and Plan.  Objective  Well appearing patient in no apparent distress; mood and affect are within normal limits.  A full examination was performed including scalp, head, eyes, ears, nose, lips, neck, chest, axillae, abdomen, back, buttocks, bilateral upper extremities, bilateral lower extremities, hands, feet, fingers, toes, fingernails, and toenails. All findings within normal limits unless otherwise noted below.   Relevant physical exam findings are noted in the Assessment and Plan.    Assessment & Plan   LENTIGINES, SEBORRHEIC KERATOSES, HEMANGIOMAS - Benign normal skin lesions - Benign-appearing - Call for any changes  MELANOCYTIC NEVI - Tan-brown and/or pink-flesh-colored symmetric macules and papules - Benign appearing on exam today - Observation - Call clinic for new or changing moles - Recommend daily use of broad spectrum spf 30+ sunscreen to sun-exposed areas.   ACTINIC DAMAGE - Chronic condition, secondary to cumulative UV/sun exposure - diffuse scaly  erythematous macules with underlying dyspigmentation - Recommend daily broad spectrum sunscreen SPF 30+ to sun-exposed areas, reapply every 2 hours as needed.  - Staying in the shade or wearing long sleeves, sun glasses (UVA+UVB protection) and wide brim hats (4-inch brim around the entire circumference of the hat) are also recommended for sun protection.  - Call for new or changing lesions.  HISTORY OF SKIN CANCER - Clear. Observe for recurrence.  - Call clinic for new or changing lesions.   - Recommend regular skin exams, daily broad-spectrum spf 30+ sunscreen use, and photoprotection.  SKIN CANCER SCREENING PERFORMED TODAY.  Recommended hydrating moisturizers    Return in about 1 year (around 01/22/2025) for FBSE F/U.  I, Lyle Cords, as acting as a Neurosurgeon for [employer], DO .   Documentation: I have reviewed the above documentation for accuracy and completeness, and I agree with the above.  Delon Lenis, DO

## 2024-01-23 NOTE — Patient Instructions (Signed)

## 2024-02-07 DIAGNOSIS — F3289 Other specified depressive episodes: Secondary | ICD-10-CM | POA: Diagnosis not present

## 2024-02-07 DIAGNOSIS — Z6824 Body mass index (BMI) 24.0-24.9, adult: Secondary | ICD-10-CM | POA: Diagnosis not present

## 2024-02-07 DIAGNOSIS — Z01419 Encounter for gynecological examination (general) (routine) without abnormal findings: Secondary | ICD-10-CM | POA: Diagnosis not present

## 2024-02-07 DIAGNOSIS — R300 Dysuria: Secondary | ICD-10-CM | POA: Diagnosis not present

## 2024-03-18 DIAGNOSIS — N898 Other specified noninflammatory disorders of vagina: Secondary | ICD-10-CM | POA: Diagnosis not present

## 2024-03-18 DIAGNOSIS — N950 Postmenopausal bleeding: Secondary | ICD-10-CM | POA: Diagnosis not present

## 2025-01-23 ENCOUNTER — Ambulatory Visit: Admitting: Dermatology
